# Patient Record
Sex: Female | Born: 2000 | Race: White | Hispanic: No | Marital: Single | Smoking: Never smoker
Health system: Southern US, Community
[De-identification: ages and names within clinical notes are randomized; demographics above are authoritative.]

## PROBLEM LIST (undated history)

## (undated) DIAGNOSIS — Z789 Other specified health status: Secondary | ICD-10-CM

## (undated) DIAGNOSIS — D649 Anemia, unspecified: Secondary | ICD-10-CM

## (undated) DIAGNOSIS — U071 COVID-19: Secondary | ICD-10-CM

## (undated) HISTORY — DX: Other specified health status: Z78.9

## (undated) HISTORY — PX: NO PAST SURGERIES: SHX2092

---
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
## 2019-02-08 ENCOUNTER — Emergency Department (HOSPITAL_COMMUNITY)
Admission: EM | Admit: 2019-02-08 | Discharge: 2019-02-08 | Disposition: A | Discharge status: Home / Self Care | Payer: Medicaid Other | Attending: Emergency Medicine | Admitting: Emergency Medicine

## 2019-02-08 ENCOUNTER — Other Ambulatory Visit: Payer: Self-pay

## 2019-02-08 ENCOUNTER — Encounter (HOSPITAL_COMMUNITY): Payer: Self-pay | Admitting: *Deleted

## 2019-02-08 DIAGNOSIS — U071 COVID-19: Secondary | ICD-10-CM | POA: Diagnosis not present

## 2019-02-08 DIAGNOSIS — J029 Acute pharyngitis, unspecified: Secondary | ICD-10-CM

## 2019-02-08 DIAGNOSIS — R430 Anosmia: Secondary | ICD-10-CM

## 2019-02-08 DIAGNOSIS — O9989 Other specified diseases and conditions complicating pregnancy, childbirth and the puerperium: Secondary | ICD-10-CM | POA: Diagnosis present

## 2019-02-08 DIAGNOSIS — Z3A24 24 weeks gestation of pregnancy: Secondary | ICD-10-CM | POA: Insufficient documentation

## 2019-02-08 DIAGNOSIS — Z79899 Other long term (current) drug therapy: Secondary | ICD-10-CM | POA: Insufficient documentation

## 2019-02-08 NOTE — ED Notes (Signed)
Patient verbalizes understanding of discharge instructions. Opportunity for questioning and answers were provided. Armband removed by staff, pt discharged from ED. Pt ambulatory to lobby. Follow up care reviewed 

## 2019-02-08 NOTE — ED Notes (Signed)
Fetal Heart Rate: 151 Pt HR: 75

## 2019-02-08 NOTE — ED Triage Notes (Signed)
Pt is here to get a Covid 19 test.  She states loss of smell and taste, vomiting.  Pt is [redacted] weeks pregnant but denies any abdominal pain or vag s/s.

## 2019-02-08 NOTE — Discharge Instructions (Signed)
You were tested for covid today.  The results should return 24- 48 hours.  If positive, you should receive a phone call.  Either way, you may check online on MyChart. You likely have a viral illness.  Use Tylenol as needed for sore throat, aches, or fever. Follow-up with your OB/GYN for further management of your pregnancy.  I also included information for the [facility].  Regardless, you should talk to your OB/GYN about delivery plans. Return to the emergency room with any new, worsening, concerning symptoms

## 2019-02-08 NOTE — ED Provider Notes (Signed)
[facility] EMERGENCY DEPARTMENT Provider Note   CSN: [id number] Arrival date & time: 02/08/19  1509    History   Chief Complaint Chief Complaint  Patient presents with   Headache/ covid test    loss of taste and smell   Routine Prenatal Visit    HPI Rayleen Wyrick is a 18 y.o. female presenting for evaluation of decreased taste and smell.  Patient states she is concerned that she has coded.  She is [redacted] weeks pregnant, and her boyfriend recently had nasal congestion and sore throat.  Over the past week, she has developed decreased taste and smell.  She also reports a mild sore throat.  She denies fevers, chills, cough, chest pain, shortness of breath, vomiting, abdominal pain, urinary symptoms, normal bowel movements.  She states she has felt slightly nauseous.  She denies vaginal bleeding or vaginal discharge.  She denies sick contact other than her boyfriend.  She denies contact with known COVID-19 positive person.  Patient is also concerned about preeclampsia, because her sister had that when she was pregnant.  Patient is getting her OB care through the health department.  Patient states she has no medical problems, takes medications daily.     HPI  Past Medical History:  Diagnosis Date   Medical history non-contributory     Patient Active Problem List   Diagnosis Date Noted   Nonintractable headache 07/02/2018   Pelvic cramping 07/02/2018    Past Surgical History:  Procedure Laterality Date   NO PAST SURGERIES       OB History    Gravida  1   Para      Term      Preterm      AB      Living        SAB      TAB      Ectopic      Multiple      Live Births               Home Medications    Prior to Admission medications   Medication Sig Start Date End Date Taking? Authorizing Provider  naproxen (NAPROSYN) 500 MG tablet Take 1 tablet (500 mg total) by mouth 2 (two) times daily with a meal. Patient not taking: Reported  on 11/26/2018 07/02/18   Alfonso Ramus T, FNP  norethindrone-ethinyl estradiol-iron (JUNEL FE 1.5/30) 1.5-30 MG-MCG tablet Take 1 tablet by mouth daily. Patient not taking: Reported on 11/26/2018 07/09/18   Verneda Skill, FNP  Prenatal Vit-Fe Fumarate-FA (PRENATAL VITAMIN PO) Take by mouth.    [provider]    Family History No family history on file.  Social History Social History   Tobacco Use   Smoking status: Never Smoker   Smokeless tobacco: Never Used  Substance Use Topics   Alcohol use: Not Currently   Drug use: Never     Allergies   Patient has no known allergies.   Review of Systems Review of Systems  HENT: Positive for sore throat.        Loss of taste and smell  Gastrointestinal: Positive for nausea.  All other systems reviewed and are negative.    Physical Exam Updated Vital Signs BP 113/74 (BP Location: Left Arm)    Pulse 75    Temp 98.3 F (36.8 C) (Oral)    Resp 16    Ht  (1.575 m)    Wt 68.9 kg    LMP 08/21/2018  SpO2 99%    BMI 27.80 kg/m   Physical Exam Vitals signs and nursing note reviewed.  Constitutional:      General: She is not in acute distress.    Appearance: She is well-developed.     Comments: Appears nontoxic  HENT:     Head: Normocephalic and atraumatic.     Mouth/Throat:     Lips: Pink.     Pharynx: Posterior oropharyngeal erythema present.     Comments: Mild erythema.  No tonsillar swelling or exudate.  Uvula midline with palate rise.  Handling secretions easily.  No muffled voice. Eyes:     Conjunctiva/sclera: Conjunctivae normal.     Pupils: Pupils are equal, round, and reactive to light.  Neck:     Musculoskeletal: Normal range of motion and neck supple.  Cardiovascular:     Rate and Rhythm: Normal rate and regular rhythm.     Pulses: Normal pulses.  Pulmonary:     Effort: Pulmonary effort is normal. No respiratory distress.     Breath sounds: Normal breath sounds. No wheezing.     Comments:  Speaking in full sentences.  Clear lung sounds in all fields. Abdominal:     General: There is no distension.     Palpations: Abdomen is soft. There is no mass.     Tenderness: There is no abdominal tenderness. There is no guarding or rebound.  Musculoskeletal: Normal range of motion.  Skin:    General: Skin is warm and dry.     Capillary Refill: Capillary refill takes less than 2 seconds.  Neurological:     Mental Status: She is alert and oriented to person, place, and time.      ED Treatments / Results  Labs (all labs ordered are listed, but only abnormal results are displayed) Labs Reviewed  NOVEL CORONAVIRUS, NAA (HOSPITAL ORDER, SEND-OUT TO REF LAB)    EKG None  Radiology No results found.  Procedures Procedures (including critical care time)  Medications Ordered in ED Medications - No data to display   Initial Impression / Assessment and Plan / ED Course  I have reviewed the triage vital signs and the nursing notes.  Pertinent labs & imaging results that were available during my care of the patient were reviewed by me and considered in my medical decision making (see chart for details).        Patient presenting due to concerns for coronavirus.  Physical examination, she appears nontoxic.   She is afebrile not tachycardic.  She denies respiratory symptoms such as cough, fever, shortness of breath.  She does report a mild sore throat which began today.  Not consistent with strep. patient's main symptoms are loss of taste and smell.  Additionally, patient is [redacted] weeks pregnant.  She is concerned about preeclampsia, although her blood pressure today is reassuring.  She has no history of preeclampsia in this pregnancy.  She has no leg swelling.  Doubt preeclampsia at this time. Fetal heart tones reassuring.  Discussed with patient that my suspicion for COVID is low at this time, however considering that she is pregnant, will test for coronavirus.  Discussed results are  pending, she should get a phone call if positive.  Discussed importance of quarantine until stools return.  Encouraged patient to follow-up with her OB/GYN for further evaluation and management of her pregnancy.  At this time, patient appears safe for discharge.  Return precautions given.  Patient states she understands and agrees plan.  Lorinda CreedKeissy Tufte was evaluated  in Emergency Department on 02/08/2019 for the symptoms described in the history of present illness. She was evaluated in the context of the global COVID-19 pandemic, which necessitated consideration that the patient might be at risk for infection with the SARS-CoV-2 virus that causes COVID-19. Institutional protocols and algorithms that pertain to the evaluation of patients at risk for COVID-19 are in a state of rapid change based on information released by regulatory bodies including the CDC and federal and state organizations. These policies and algorithms were followed during the patient's care in the ED.   Final Clinical Impressions(s) / ED Diagnoses   Final diagnoses:  Anosmia  Sore throat  [redacted] weeks gestation of pregnancy    ED Discharge Orders    None       Alveria Apley, PA-C 02/08/19 1853    Cathren Laine, MD 02/09/19 1423

## 2019-02-10 LAB — NOVEL CORONAVIRUS, NAA (HOSP ORDER, SEND-OUT TO REF LAB; TAT 18-24 HRS): SARS-CoV-2, NAA: DETECTED — AB

## 2019-03-02 ENCOUNTER — Encounter: Payer: Self-pay | Admitting: Advanced Practice Midwife

## 2019-03-02 DIAGNOSIS — U071 COVID-19: Secondary | ICD-10-CM | POA: Insufficient documentation

## 2019-05-05 LAB — OB RESULTS CONSOLE GBS: GBS: POSITIVE

## 2019-05-06 ENCOUNTER — Other Ambulatory Visit: Payer: Self-pay | Admitting: Obstetrics and Gynecology

## 2019-05-06 DIAGNOSIS — O365930 Maternal care for other known or suspected poor fetal growth, third trimester, not applicable or unspecified: Secondary | ICD-10-CM

## 2019-05-06 NOTE — Progress Notes (Signed)
[facility] pt with U/S growth < 3 % at 36 6/7 weeks Formal U/S and doppler studies with MFM ordered

## 2019-05-07 ENCOUNTER — Other Ambulatory Visit: Payer: Self-pay

## 2019-05-07 ENCOUNTER — Ambulatory Visit (HOSPITAL_COMMUNITY): Payer: Medicaid Other | Admitting: *Deleted

## 2019-05-07 ENCOUNTER — Other Ambulatory Visit: Payer: Self-pay | Admitting: Obstetrics and Gynecology

## 2019-05-07 ENCOUNTER — Encounter (HOSPITAL_COMMUNITY): Payer: Self-pay

## 2019-05-07 ENCOUNTER — Ambulatory Visit (HOSPITAL_COMMUNITY)
Admission: RE | Admit: 2019-05-07 | Discharge: 2019-05-07 | Disposition: A | Discharge status: Home / Self Care | Payer: Medicaid Other | Source: Ambulatory Visit | Attending: Obstetrics and Gynecology | Admitting: Obstetrics and Gynecology

## 2019-05-07 VITALS — BP 111/81 | HR 90 | Temp 98.5°F

## 2019-05-07 DIAGNOSIS — Z3A37 37 weeks gestation of pregnancy: Secondary | ICD-10-CM

## 2019-05-07 DIAGNOSIS — O26843 Uterine size-date discrepancy, third trimester: Secondary | ICD-10-CM | POA: Diagnosis present

## 2019-05-07 DIAGNOSIS — O365930 Maternal care for other known or suspected poor fetal growth, third trimester, not applicable or unspecified: Secondary | ICD-10-CM

## 2019-05-07 DIAGNOSIS — Z363 Encounter for antenatal screening for malformations: Secondary | ICD-10-CM | POA: Diagnosis not present

## 2019-05-07 NOTE — Procedures (Signed)
Katie Ochoa 2001-03-23 [redacted]w[redacted]d  Fetus A Non-Stress Test Interpretation for 05/07/19  Indication: IUGR  Fetal Heart Rate A Mode: External Baseline Rate (A): 135 bpm Variability: Moderate Accelerations: 15 x 15 Decelerations: None  Uterine Activity Mode: Toco Contraction Frequency (min): occ UC noted Contraction Duration (sec): 80 Contraction Quality: Mild Resting Tone Palpated: Relaxed Resting Time: Adequate  Interpretation (Fetal Testing) Nonstress Test Interpretation: Reactive Comments: FHR tracing rev'd by Dr. Donalee Citrin

## 2019-05-07 NOTE — Progress Notes (Signed)
Admission orders for G1PO [facility] with IUGR < 3 %. Pt made of aware of need for admission and date and time of IOL.

## 2019-05-10 ENCOUNTER — Other Ambulatory Visit (HOSPITAL_COMMUNITY): Payer: Self-pay | Admitting: Advanced Practice Midwife

## 2019-05-11 ENCOUNTER — Inpatient Hospital Stay (HOSPITAL_COMMUNITY): Payer: Medicaid Other

## 2019-05-11 ENCOUNTER — Other Ambulatory Visit: Payer: Self-pay

## 2019-05-11 ENCOUNTER — Inpatient Hospital Stay (HOSPITAL_COMMUNITY)
Admission: AD | Admit: 2019-05-11 | Discharge: 2019-05-14 | DRG: 807 | Disposition: A | Discharge status: Home / Self Care | Payer: Medicaid Other | Attending: Family Medicine | Admitting: Family Medicine

## 2019-05-11 ENCOUNTER — Encounter (HOSPITAL_COMMUNITY): Payer: Self-pay

## 2019-05-11 DIAGNOSIS — O365990 Maternal care for other known or suspected poor fetal growth, unspecified trimester, not applicable or unspecified: Secondary | ICD-10-CM | POA: Diagnosis present

## 2019-05-11 DIAGNOSIS — Z30017 Encounter for initial prescription of implantable subdermal contraceptive: Secondary | ICD-10-CM

## 2019-05-11 DIAGNOSIS — B951 Streptococcus, group B, as the cause of diseases classified elsewhere: Secondary | ICD-10-CM | POA: Diagnosis present

## 2019-05-11 DIAGNOSIS — O99824 Streptococcus B carrier state complicating childbirth: Secondary | ICD-10-CM | POA: Diagnosis present

## 2019-05-11 DIAGNOSIS — O99214 Obesity complicating childbirth: Secondary | ICD-10-CM | POA: Diagnosis present

## 2019-05-11 DIAGNOSIS — Z3A37 37 weeks gestation of pregnancy: Secondary | ICD-10-CM

## 2019-05-11 DIAGNOSIS — E669 Obesity, unspecified: Secondary | ICD-10-CM | POA: Diagnosis present

## 2019-05-11 DIAGNOSIS — O365930 Maternal care for other known or suspected poor fetal growth, third trimester, not applicable or unspecified: Principal | ICD-10-CM | POA: Diagnosis present

## 2019-05-11 DIAGNOSIS — O134 Gestational [pregnancy-induced] hypertension without significant proteinuria, complicating childbirth: Secondary | ICD-10-CM | POA: Diagnosis present

## 2019-05-11 DIAGNOSIS — Z20828 Contact with and (suspected) exposure to other viral communicable diseases: Secondary | ICD-10-CM | POA: Diagnosis present

## 2019-05-11 DIAGNOSIS — O365931 Maternal care for other known or suspected poor fetal growth, third trimester, fetus 1: Secondary | ICD-10-CM

## 2019-05-11 HISTORY — DX: Anemia, unspecified: D64.9

## 2019-05-11 HISTORY — DX: COVID-19: U07.1

## 2019-05-11 LAB — CBC
HCT: 37.4 % (ref 36.0–46.0)
Hemoglobin: 12.4 g/dL (ref 12.0–15.0)
MCH: 28 pg (ref 26.0–34.0)
MCHC: 33.2 g/dL (ref 30.0–36.0)
MCV: 84.4 fL (ref 80.0–100.0)
Platelets: 310 10*3/uL (ref 150–400)
RBC: 4.43 MIL/uL (ref 3.87–5.11)
RDW: 13.7 % (ref 11.5–15.5)
WBC: 11.5 10*3/uL — ABNORMAL HIGH (ref 4.0–10.5)
nRBC: 0 % (ref 0.0–0.2)

## 2019-05-11 LAB — RPR: RPR Ser Ql: NONREACTIVE

## 2019-05-11 LAB — TYPE AND SCREEN
ABO/RH(D): O POS
Antibody Screen: NEGATIVE

## 2019-05-11 LAB — ABO/RH: ABO/RH(D): O POS

## 2019-05-11 LAB — SARS CORONAVIRUS 2 (TAT 6-24 HRS): SARS Coronavirus 2: NEGATIVE

## 2019-05-11 MED ORDER — ONDANSETRON HCL 4 MG/2ML IJ SOLN
4.0000 mg | Freq: Four times a day (QID) | INTRAMUSCULAR | Status: DC | PRN
  Administered 2019-05-11: 4 mg via INTRAVENOUS
  Filled 2019-05-11: qty 2

## 2019-05-11 MED ORDER — OXYCODONE-ACETAMINOPHEN 5-325 MG PO TABS: 1.0000 | ORAL_TABLET | ORAL | Status: DC | PRN

## 2019-05-11 MED ORDER — TERBUTALINE SULFATE 1 MG/ML IJ SOLN: 0.2500 mg | Freq: Once | INTRAMUSCULAR | Status: DC | PRN

## 2019-05-11 MED ORDER — LACTATED RINGERS IV SOLN
INTRAVENOUS | Status: DC
  Administered 2019-05-11 – 2019-05-12 (×4): via INTRAVENOUS

## 2019-05-11 MED ORDER — SOD CITRATE-CITRIC ACID 500-334 MG/5ML PO SOLN: 30.0000 mL | ORAL | Status: DC | PRN

## 2019-05-11 MED ORDER — SODIUM CHLORIDE 0.9 % IV SOLN
5.0000 10*6.[IU] | Freq: Once | INTRAVENOUS | Status: AC
  Administered 2019-05-11: 5 10*6.[IU] via INTRAVENOUS
  Filled 2019-05-11: qty 5

## 2019-05-11 MED ORDER — MISOPROSTOL 50MCG HALF TABLET
50.0000 ug | ORAL_TABLET | ORAL | Status: DC | PRN
  Administered 2019-05-11: 22:00:00 50 ug via ORAL
  Filled 2019-05-11: qty 1

## 2019-05-11 MED ORDER — OXYTOCIN BOLUS FROM INFUSION
500.0000 mL | Freq: Once | INTRAVENOUS | Status: AC
  Administered 2019-05-12: 09:00:00 500 mL via INTRAVENOUS

## 2019-05-11 MED ORDER — ACETAMINOPHEN 325 MG PO TABS: 650.0000 mg | ORAL_TABLET | ORAL | Status: DC | PRN

## 2019-05-11 MED ORDER — FENTANYL CITRATE (PF) 100 MCG/2ML IJ SOLN
100.0000 ug | INTRAMUSCULAR | Status: DC | PRN
  Administered 2019-05-12 (×2): 100 ug via INTRAVENOUS
  Filled 2019-05-11 (×3): qty 2

## 2019-05-11 MED ORDER — LACTATED RINGERS IV SOLN
500.0000 mL | INTRAVENOUS | Status: DC | PRN
  Administered 2019-05-11 – 2019-05-12 (×2): 500 mL via INTRAVENOUS

## 2019-05-11 MED ORDER — PENICILLIN G 3 MILLION UNITS IVPB - SIMPLE MED
3.0000 10*6.[IU] | INTRAVENOUS | Status: DC
  Administered 2019-05-11 – 2019-05-12 (×5): 3 10*6.[IU] via INTRAVENOUS
  Filled 2019-05-11 (×7): qty 100

## 2019-05-11 MED ORDER — OXYTOCIN 40 UNITS IN NORMAL SALINE INFUSION - SIMPLE MED
2.5000 [IU]/h | INTRAVENOUS | Status: DC
  Filled 2019-05-11: qty 1000

## 2019-05-11 MED ORDER — FENTANYL CITRATE (PF) 100 MCG/2ML IJ SOLN: 50.0000 ug | INTRAMUSCULAR | Status: DC | PRN

## 2019-05-11 MED ORDER — MISOPROSTOL 50MCG HALF TABLET
ORAL_TABLET | ORAL | Status: AC
  Administered 2019-05-12: 50 ug
  Filled 2019-05-11: qty 1

## 2019-05-11 MED ORDER — LIDOCAINE HCL (PF) 1 % IJ SOLN
30.0000 mL | INTRAMUSCULAR | Status: AC | PRN
  Administered 2019-05-12: 30 mL via SUBCUTANEOUS
  Filled 2019-05-11: qty 30

## 2019-05-11 MED ORDER — OXYCODONE-ACETAMINOPHEN 5-325 MG PO TABS: 2.0000 | ORAL_TABLET | ORAL | Status: DC | PRN

## 2019-05-11 MED ORDER — MISOPROSTOL 25 MCG QUARTER TABLET
25.0000 ug | ORAL_TABLET | ORAL | Status: DC | PRN
  Administered 2019-05-11 (×3): 25 ug via VAGINAL
  Filled 2019-05-11 (×3): qty 1

## 2019-05-11 NOTE — Progress Notes (Signed)
Patient ID: Katie Ochoa, female   DOB: [birth year], 18 y.o.   MRN: [id number] Katie Ochoa is a 18 y.o. G1P0 at [redacted]w[redacted]d admitted for induction of labor due to FGR 3%.  Subjective: Mild cramping  Objective: BP 128/88   Pulse 97   Temp 99.2 F (37.3 C) (Oral)   Resp 15   Ht 5\' 1"  (1.549 m)   Wt 78 kg   LMP 08/21/2018   SpO2 98%   BMI 32.50 kg/m  No intake/output data recorded.  FHT:  FHR: 125 bpm, variability: moderate,  accelerations:  Present,  decelerations:  Absent UC:   q 2-15mins  SVE:   Dilation: 1 Effacement (%): Thick Station: -2, -3 Exam by:: Gertie Exon, CNM)   Cervical foley bulb inserted and inflated w/ 48ml LR w/o difficulty   Labs: Lab Results  Component Value Date   WBC 11.5 (H) 05/11/2019   HGB 12.4 05/11/2019   HCT 37.4 05/11/2019   MCV 84.4 05/11/2019   PLT 310 05/11/2019    Assessment / Plan: IOL d/t FGR, s/p cytotec x 3, now has cervical foley bulb, will continue cytotec, plan pitocin once foley bulb out  Labor: cervical ripening phase Fetal Wellbeing:  Category I Pain Control:  n/a Pre-eclampsia: n/a I/D:  GBS+ on PCN Anticipated MOD:  NSVD  Roma Schanz CNM, WHNP-BC 05/11/2019, 2115

## 2019-05-11 NOTE — H&P (Addendum)
Katie Ochoa is a 18 y.o. female G1 at 37.4wks by LMP and confirmed by 13wk scan  presenting for IOL due to [place] diagnosed at 37wks with EFW 3rd% and HC/AC/FL all <5th%. Rec for IOL by MFM prior to 38wks due to severe FGR vs constitutionally small baby. Denies leaking or bldg; no reg ctx; no H/A, visual disturbances or N/V. Her preg has been followed by the [facility] and has been remarkable for:  # FGR dx at 37wks (EFW 3rd%, HC/FL/AC all <5th%, nl fluid & dopplers) # COVID+ on 02/08/19 with full recovery # GBS+  OB History    Gravida  1   Para      Term      Preterm      AB      Living        SAB      TAB      Ectopic      Multiple      Live Births             Past Medical History:  Diagnosis Date  . Anemia   . COVID-19    02/08/19   Past Surgical History:  Procedure Laterality Date  . NO PAST SURGERIES     Family History: family history is not on file. Social History:  reports that she has never smoked. She has never used smokeless tobacco. She reports previous alcohol use. She reports that she does not use drugs.     Maternal Diabetes: No Genetic Screening: Normal Maternal Ultrasounds/Referrals: IUGR Fetal Ultrasounds or other Referrals:  Referred to Materal Fetal Medicine  Maternal Substance Abuse:  No Significant Maternal Medications:  None Significant Maternal Lab Results:  Group B Strep positive Other Comments:  None  ROS History Dilation: Closed Effacement (%): Thick Station: -1 Exam by:: Sharyn Lull, RN  Blood pressure 124/68, pulse 72, temperature 98.5 F (36.9 C), temperature source Oral, resp. rate 18, height 5\' 1"  (1.549 m), weight 78 kg, last menstrual period 08/21/2018, SpO2 96 %. Exam Physical Exam  Constitutional: She is oriented to person, place, and time. She appears well-developed.  HENT:  Head: Normocephalic.  Neck: Normal range of motion.  Cardiovascular: Normal rate.  Respiratory: Effort normal.  GI:  EFM 130s, +accels, no decels,  Cat 1 Irreg mild ctx  Musculoskeletal: Normal range of motion.  Neurological: She is alert and oriented to person, place, and time.  Skin: Skin is warm and dry.  Psychiatric: She has a normal mood and affect. Her behavior is normal. Thought content normal.    Prenatal labs: ABO, Rh: --/--/O POS (08/25 0825) Antibody: NEG (08/25 0825) Rubella: Immune (03/12 0000) RPR: NON-REACTIVE (10/17 1037)  HBsAg: Negative (03/12 0000)  HIV: Non-reactive (03/12 0000)  GBS: Positive (08/19 0000)   Assessment/Plan: IUP@37 .4wks Severe FGR with nl fluid & dopplers Cx unfavorable GBS pos  -Admit to Labor & Delivery -Plan cx ripening with cytotec/cervical foley to start, followed by Pit/AROM prn -PCN for GBS ppx -Anticipate vag del   Myrtis Ser CNM 05/11/2019, 10:30 AM

## 2019-05-11 NOTE — Progress Notes (Signed)
Patient ID: Katie Ochoa, female   DOB: 18-Feb-2001, 18 y.o.   MRN: [id number]  Called by RN for eval of fetal presentation; pt feeling a little crampy, s/p first dose of cytotec x 4hrs; receiving PCN  BP 133/89, 131/79, other VSS FHR 135, +accels, no decels, Cat 1 Ctx irreg 3-5 mins, mild B/S ultrasound shows vtx presentation  IUP@37 .4wks FGR Cx unfavorable GBS pos  Continue with cytotec induction with exams q 4hrs- will place cervical foley when able  Myrtis Ser CNM 05/11/2019 1:57 PM

## 2019-05-12 ENCOUNTER — Inpatient Hospital Stay (HOSPITAL_COMMUNITY): Payer: Medicaid Other | Admitting: Anesthesiology

## 2019-05-12 ENCOUNTER — Encounter (HOSPITAL_COMMUNITY): Payer: Self-pay | Admitting: *Deleted

## 2019-05-12 DIAGNOSIS — Z3A37 37 weeks gestation of pregnancy: Secondary | ICD-10-CM

## 2019-05-12 DIAGNOSIS — O99824 Streptococcus B carrier state complicating childbirth: Secondary | ICD-10-CM

## 2019-05-12 DIAGNOSIS — O134 Gestational [pregnancy-induced] hypertension without significant proteinuria, complicating childbirth: Secondary | ICD-10-CM

## 2019-05-12 LAB — COMPREHENSIVE METABOLIC PANEL
ALT: 10 U/L (ref 0–44)
AST: 13 U/L — ABNORMAL LOW (ref 15–41)
Albumin: 2.6 g/dL — ABNORMAL LOW (ref 3.5–5.0)
Alkaline Phosphatase: 210 U/L — ABNORMAL HIGH (ref 38–126)
Anion gap: 12 (ref 5–15)
BUN: 8 mg/dL (ref 6–20)
CO2: 18 mmol/L — ABNORMAL LOW (ref 22–32)
Calcium: 9.1 mg/dL (ref 8.9–10.3)
Chloride: 105 mmol/L (ref 98–111)
Creatinine, Ser: 0.54 mg/dL (ref 0.44–1.00)
GFR calc Af Amer: >60 mL/min (ref >60)
GFR calc non Af Amer: >60 mL/min (ref >60)
Glucose, Bld: 95 mg/dL (ref 70–99)
Potassium: 4.1 mmol/L (ref 3.5–5.1)
Sodium: 135 mmol/L (ref 135–145)
Total Bilirubin: 0.6 mg/dL (ref 0.3–1.2)
Total Protein: 5.5 g/dL — ABNORMAL LOW (ref 6.5–8.1)

## 2019-05-12 LAB — CBC
HCT: 37 % (ref 36.0–46.0)
Hemoglobin: 12.5 g/dL (ref 12.0–15.0)
MCH: 28.3 pg (ref 26.0–34.0)
MCHC: 33.8 g/dL (ref 30.0–36.0)
MCV: 83.9 fL (ref 80.0–100.0)
Platelets: 291 10*3/uL (ref 150–400)
RBC: 4.41 MIL/uL (ref 3.87–5.11)
RDW: 13.6 % (ref 11.5–15.5)
WBC: 19.1 10*3/uL — ABNORMAL HIGH (ref 4.0–10.5)
nRBC: 0 % (ref 0.0–0.2)

## 2019-05-12 LAB — PROTEIN / CREATININE RATIO, URINE
Creatinine, Urine: 33.17 mg/dL
Protein Creatinine Ratio: 0.27 mg/mg{Cre} — ABNORMAL HIGH (ref 0.00–0.15)
Total Protein, Urine: 9 mg/dL

## 2019-05-12 MED ORDER — ACETAMINOPHEN 325 MG PO TABS: 650.0000 mg | ORAL_TABLET | ORAL | Status: DC | PRN

## 2019-05-12 MED ORDER — MAGNESIUM HYDROXIDE 400 MG/5ML PO SUSP: 30.0000 mL | ORAL | Status: DC | PRN

## 2019-05-12 MED ORDER — WITCH HAZEL-GLYCERIN EX PADS: 1.0000 "application " | MEDICATED_PAD | CUTANEOUS | Status: DC | PRN

## 2019-05-12 MED ORDER — BENZOCAINE-MENTHOL 20-0.5 % EX AERO
1.0000 "application " | INHALATION_SPRAY | CUTANEOUS | Status: DC | PRN
  Administered 2019-05-12: 1 via TOPICAL
  Filled 2019-05-12: qty 56

## 2019-05-12 MED ORDER — LIDOCAINE HCL (PF) 1 % IJ SOLN
INTRAMUSCULAR | Status: DC | PRN
  Administered 2019-05-12: 2 mL via EPIDURAL
  Administered 2019-05-12: 3 mL via EPIDURAL
  Administered 2019-05-12: 5 mL via EPIDURAL

## 2019-05-12 MED ORDER — PRENATAL MULTIVITAMIN CH
1.0000 | ORAL_TABLET | Freq: Every day | ORAL | Status: DC
  Administered 2019-05-12 – 2019-05-14 (×3): 1 via ORAL
  Filled 2019-05-12 (×3): qty 1

## 2019-05-12 MED ORDER — SIMETHICONE 80 MG PO CHEW: 80.0000 mg | CHEWABLE_TABLET | ORAL | Status: DC | PRN

## 2019-05-12 MED ORDER — TERBUTALINE SULFATE 1 MG/ML IJ SOLN: 0.2500 mg | Freq: Once | INTRAMUSCULAR | Status: DC | PRN

## 2019-05-12 MED ORDER — ZOLPIDEM TARTRATE 5 MG PO TABS: 5.0000 mg | ORAL_TABLET | Freq: Once | ORAL | Status: DC

## 2019-05-12 MED ORDER — COCONUT OIL OIL: 1.0000 "application " | TOPICAL_OIL | Status: DC | PRN

## 2019-05-12 MED ORDER — OXYTOCIN 40 UNITS IN NORMAL SALINE INFUSION - SIMPLE MED
1.0000 m[IU]/min | INTRAVENOUS | Status: DC
  Administered 2019-05-12: 2 m[IU]/min via INTRAVENOUS

## 2019-05-12 MED ORDER — SODIUM CHLORIDE (PF) 0.9 % IJ SOLN
INTRAMUSCULAR | Status: DC | PRN
  Administered 2019-05-12: 12 mL/h via EPIDURAL

## 2019-05-12 MED ORDER — ONDANSETRON HCL 4 MG/2ML IJ SOLN: 4.0000 mg | INTRAMUSCULAR | Status: DC | PRN

## 2019-05-12 MED ORDER — ONDANSETRON HCL 4 MG PO TABS: 4.0000 mg | ORAL_TABLET | ORAL | Status: DC | PRN

## 2019-05-12 MED ORDER — SENNOSIDES-DOCUSATE SODIUM 8.6-50 MG PO TABS
2.0000 | ORAL_TABLET | ORAL | Status: DC
  Administered 2019-05-12 – 2019-05-13 (×2): 2 via ORAL
  Filled 2019-05-12 (×2): qty 2

## 2019-05-12 MED ORDER — DIBUCAINE (PERIANAL) 1 % EX OINT: 1.0000 "application " | TOPICAL_OINTMENT | CUTANEOUS | Status: DC | PRN

## 2019-05-12 MED ORDER — IBUPROFEN 600 MG PO TABS
600.0000 mg | ORAL_TABLET | Freq: Four times a day (QID) | ORAL | Status: DC
  Administered 2019-05-12 – 2019-05-14 (×8): 600 mg via ORAL
  Filled 2019-05-12 (×9): qty 1

## 2019-05-12 MED ORDER — DIPHENHYDRAMINE HCL 25 MG PO CAPS: 25.0000 mg | ORAL_CAPSULE | Freq: Four times a day (QID) | ORAL | Status: DC | PRN

## 2019-05-12 MED ORDER — FENTANYL-BUPIVACAINE-NACL 0.5-0.125-0.9 MG/250ML-% EP SOLN
EPIDURAL | Status: AC
  Filled 2019-05-12: qty 250

## 2019-05-12 NOTE — Anesthesia Postprocedure Evaluation (Signed)
Anesthesia Post Note  Patient: Katie Ochoa  Procedure(s) Performed: AN AD HOC LABOR EPIDURAL     Anesthesia Post Evaluation  Last Vitals:  Vitals:   05/12/19 1207 05/12/19 1650  BP: 113/86 110/78  Pulse: 78 82  Resp: 18 18  Temp: 36.7 C 36.8 C  SpO2:      Last Pain:  Vitals:   05/12/19 1650  TempSrc: Axillary  PainSc: 0-No pain   Pain Goal: Patients Stated Pain Goal: 3 (05/11/19 4097)                 Barkley Boards

## 2019-05-12 NOTE — Anesthesia Procedure Notes (Signed)
Epidural Patient location during procedure: OB Start time: 05/12/2019 7:37 AM End time: 05/12/2019 7:44 AM  Staffing Anesthesiologist: Catalina Gravel, MD Performed: anesthesiologist   Preanesthetic Checklist Completed: patient identified, pre-op evaluation, timeout performed, IV checked, risks and benefits discussed and monitors and equipment checked  Epidural Patient position: sitting Prep: DuraPrep Patient monitoring: blood pressure and continuous pulse ox Approach: midline Location: L3-L4 Injection technique: LOR air  Needle:  Needle type: Tuohy  Needle gauge: 17 G Needle length: 9 cm Needle insertion depth: 6 cm Catheter size: 19 Gauge Catheter at skin depth: 11 cm Test dose: negative and Other (1% Lidocaine)  Additional Notes Patient identified.  Risk benefits discussed including failed block, incomplete pain control, headache, nerve damage, paralysis, blood pressure changes, nausea, vomiting, reactions to medication both toxic or allergic, and postpartum back pain.  Patient expressed understanding and wished to proceed.  All questions were answered.  Sterile technique used throughout procedure and epidural site dressed with sterile barrier dressing. No paresthesia or other complications noted. The patient did not experience any signs of intravascular injection such as tinnitus or metallic taste in mouth nor signs of intrathecal spread such as rapid motor block. Please see nursing notes for vital signs. Reason for block:procedure for pain

## 2019-05-12 NOTE — Discharge Summary (Addendum)
Postpartum Discharge Summary     Patient Name: Katie Ochoa DOB: 2000-11-29 MRN: [id number]  Date of admission: 05/11/2019 Delivering Provider: Venora MaplesECKSTAT, MATTHEW M   Date of discharge: 05/14/2019  Admitting diagnosis: pregnancy Intrauterine pregnancy: 5763w5d     Secondary diagnosis:  Active Problems:   IUGR (intrauterine growth restriction) affecting care of mother   Positive GBS test  Additional problems:  Intrapartum gHTN     Discharge diagnosis: Term Pregnancy Delivered and Gestational Hypertension                                                                                          Post partum procedures:Nexplanon  Augmentation: Pitocin, Cytotec and Foley Balloon  Complications: None  Hospital course:  Induction of Labor With Vaginal Delivery   18 y.o. yo G1P0 at 7663w5d was admitted to the hospital 05/11/2019 for induction of labor.  Indication for induction: IUGR, gHTN.  Patient had an uncomplicated labor course as follows: Membrane Rupture Time/Date: 6:54 AM ,05/12/2019   Intrapartum Procedures: Episiotomy: None [1]                                         Lacerations:  2nd degree [3]  Patient had delivery of a Viable infant.  Information for the patient's newborn:  Leatrice JewelsMedina, Boy Lillianah [[id number]][[id number]]  Delivery Method: Vag-Spont    05/12/2019  Details of delivery can be found in separate delivery note.  Patient had a routine postpartum course. Patient is discharged home 05/14/19.  Magnesium Sulfate recieved: No BMZ received: No  Physical exam  Vitals:   05/13/19 0527 05/13/19 1444 05/13/19 2132 05/14/19 0621  BP: 120/75 114/74 124/83 122/90  Pulse: (!) 101 70 83 76  Resp: 18 18 18 17   Temp: 98.3 F (36.8 C) 98.8 F (37.1 C) 97.6 F (36.4 C) 98.7 F (37.1 C)  TempSrc: Oral Oral Oral Oral  SpO2: 99%  100% 100%  Weight:      Height:       General: alert, cooperative and no distress Lochia: appropriate Uterine Fundus: firm Incision: N/A DVT Evaluation: No  evidence of DVT seen on physical exam. Labs: Lab Results  Component Value Date   WBC 19.1 (H) 05/12/2019   HGB 12.5 05/12/2019   HCT 37.0 05/12/2019   MCV 83.9 05/12/2019   PLT 291 05/12/2019   CMP Latest Ref Rng & Units 05/12/2019  Glucose 70 - 99 mg/dL 95  BUN 6 - 20 mg/dL 8  Creatinine 7.820.44 - 9.561.00 mg/dL 2.130.54  Sodium 086135 - 578145 mmol/L 135  Potassium 3.5 - 5.1 mmol/L 4.1  Chloride 98 - 111 mmol/L 105  CO2 22 - 32 mmol/L 18(L)  Calcium 8.9 - 10.3 mg/dL 9.1  Total Protein 6.5 - 8.1 g/dL 4.6(N5.5(L)  Total Bilirubin 0.3 - 1.2 mg/dL 0.6  Alkaline Phos 38 - 126 U/L 210(H)  AST 15 - 41 U/L 13(L)  ALT 0 - 44 U/L 10    Discharge instruction: per After Visit Summary and "Baby and Me Booklet".  After visit meds:  Allergies as of 05/14/2019   No Known Allergies     Medication List    STOP taking these medications   norethindrone-ethinyl estradiol-iron 1.5-30 MG-MCG tablet Commonly known as: Junel FE 1.5/30     TAKE these medications   acetaminophen 325 MG tablet Commonly known as: Tylenol Take 2 tablets (650 mg total) by mouth every 4 (four) hours as needed (for pain scale < 4).   naproxen 500 MG tablet Commonly known as: NAPROSYN Take 1 tablet (500 mg total) by mouth 2 (two) times daily with a meal.   PRENATAL VITAMIN PO Take by mouth.       Diet: No evidence of DVT seen on physical exam.  Activity: Advance as tolerated. Pelvic rest for 6 weeks.   Outpatient follow up:1 week BP check, 6 week PP visit Follow up Appt:No future appointments. Follow up Visit:   Please schedule this patient for Postpartum visit in: 1 week with the following provider: Any provider For C/S patients schedule nurse incision check in weeks 2 weeks: no High risk pregnancy complicated by: IUGR, gHTN Delivery mode:  SVD Anticipated Birth Control:  other/unsure PP Procedures needed: BP check  Schedule Integrated [place] visit: no  Newborn Data: Live born female  Birth Weight:  4lb 10.6oz APGAR:  44, 9  Newborn Delivery   Birth date/time: 05/12/2019 08:43:00 Delivery type: Vaginal, Spontaneous      Baby Feeding: Breast Disposition:home with mother   05/14/2019 Matilde Haymaker, MD  I saw and evaluated the patient. I agree with the findings and the plan of care as documented in the resident's note. BP's WNL. Patient to discharge and room-in with baby. Nexplanon placed 8/28. Instructed to make visit at HD in next week to have a BP check. Patient does not own a cuff at home.   Barrington Ellison, MD [facility] Fellow, [facility] for [employer], [place]

## 2019-05-12 NOTE — Anesthesia Preprocedure Evaluation (Signed)
Anesthesia Evaluation  Patient identified by MRN, date of birth, ID band Patient awake    Reviewed: Allergy & Precautions, NPO status , Patient's Chart, lab work & pertinent test results  Airway Mallampati: II  TM Distance: >3 FB Neck ROM: Full    Dental  (+) Teeth Intact, Dental Advisory Given   Pulmonary neg pulmonary ROS,    Pulmonary exam normal breath sounds clear to auscultation       Cardiovascular negative cardio ROS Normal cardiovascular exam Rhythm:Regular Rate:Normal     Neuro/Psych  Headaches,    GI/Hepatic negative GI ROS, Neg liver ROS,   Endo/Other  Obesity   Renal/GU negative Renal ROS     Musculoskeletal negative musculoskeletal ROS (+)   Abdominal   Peds  Hematology negative hematology ROS (+) Plt 291k   Anesthesia Other Findings Day of surgery medications reviewed with the patient.  Reproductive/Obstetrics (+) Pregnancy                             Anesthesia Physical Anesthesia Plan  ASA: II  Anesthesia Plan: Epidural   Post-op Pain Management:    Induction:   PONV Risk Score and Plan: 2 and Treatment may vary due to age or medical condition  Airway Management Planned: Natural Airway  Additional Equipment:   Intra-op Plan:   Post-operative Plan:   Informed Consent: I have reviewed the patients History and Physical, chart, labs and discussed the procedure including the risks, benefits and alternatives for the proposed anesthesia with the patient or authorized representative who has indicated his/her understanding and acceptance.     Dental advisory given  Plan Discussed with:   Anesthesia Plan Comments: (Patient identified. Risks/Benefits/Options discussed with patient including but not limited to bleeding, infection, nerve damage, paralysis, failed block, incomplete pain control, headache, blood pressure changes, nausea, vomiting, reactions to  medication both or allergic, itching and postpartum back pain. Confirmed with bedside nurse the patient's most recent platelet count. Confirmed with patient that they are not currently taking any anticoagulation, have any bleeding history or any family history of bleeding disorders. Patient expressed understanding and wished to proceed. All questions were answered. )        Anesthesia Quick Evaluation

## 2019-05-12 NOTE — Lactation Note (Signed)
This note was copied from a Katie's chart. Lactation Consultation Note  Patient Name: Katie Ochoa JJHER'D Date: 05/12/2019 Reason for consult: Initial assessment;Primapara;1st time breastfeeding;Infant < 6lbs   1844 - 1913 - I conducted an initial breast feeding consult with Katie Ochoa. She has been breast and bottle feeding. Her preference is to do all breast feeding followed by providing some pumped milk by bottle. Formula is her last preference, but she is open to providing it.  Katie "[facility]" is under 6 lbs and has latched a few times since delivery. She states that he has recently refused breast and bottle, and it has been several hours since his last feeding. He was in quiet alert state upon entry. I assisted with waking him up and helping mom latch him in cross cradle hold on her left breast. He opens his mouth wide and latches and becomes sleepy. After a few attempts he latched with some rhythmical suckling sequences.  Katie Ochoa does not have a breast pump, but she has [facility].   I showed mom how to hand express. She repeats back well and has copious colostrum.  I shared the LPTI protocol due to Katie's birth weight and initiated her DEBP set up.   PLAN: - Breast feed 8-12 times/day on demand, waking to feed as needed. - Follow LPTI protocol due to birth weight and early term status - Pump following on the initiate setting for 15 minutes - Supplement 5-10 mls of pumped breast milk or formula following (Katie already receiving some formula by bottle) - Hand express to support milk production   Maternal Data Formula Feeding for Exclusion: No Has patient been taught Hand Expression?: Yes Does the patient have breastfeeding experience prior to this delivery?: No  Feeding Feeding Type: Breast Fed Nipple Type: Slow - flow  LATCH Score Latch: Grasps breast easily, tongue down, lips flanged, rhythmical sucking.  Audible Swallowing: A few with stimulation  Type of  Nipple: Everted at rest and after stimulation  Comfort (Breast/Nipple): Soft / non-tender  Hold (Positioning): Assistance needed to correctly position infant at breast and maintain latch.  LATCH Score: 8  Interventions Interventions: Breast feeding basics reviewed;Assisted with latch;Hand express;Breast compression;Adjust position;Support pillows;DEBP  Lactation Tools Discussed/Used Tools: Pump Breast pump type: Double-Electric Breast Pump WIC Program: Yes Pump Review: Setup, frequency, and cleaning;Milk Storage Initiated by:: hl Date initiated:: 05/12/19   Consult Status Consult Status: Follow-up Date: 05/13/19 Follow-up type: In-patient    Katie Ochoa 05/12/2019, 8:11 PM

## 2019-05-12 NOTE — Progress Notes (Signed)
Patient ID: Katie Ochoa, female   DOB: 2000-11-29, 18 y.o.   MRN: [id number] Katie Ochoa is a 18 y.o. G1P0 at [redacted]w[redacted]d admitted for induction of labor due to FGR 3%. Intrapartum HTN.  Subjective: Foley bulb out, hurting  Objective: BP (!) 143/77   Pulse 74   Temp 98.9 F (37.2 C) (Oral)   Resp 16   Ht 5\' 1"  (1.549 m)   Wt 78 kg   LMP 08/21/2018   SpO2 99%   BMI 32.50 kg/m  No intake/output data recorded.  FHT:  FHR: 135 bpm, variability: moderate,  accelerations:  Present,  decelerations:  Present mild variables UC:   q 2-39mins  SVE:   5/70/-1 Foley bulb out  Labs: Lab Results  Component Value Date   WBC 19.1 (H) 05/12/2019   HGB 12.5 05/12/2019   HCT 37.0 05/12/2019   MCV 83.9 05/12/2019   PLT 291 05/12/2019    Assessment / Plan: IOL d/t FGR, s/p cytotec and foley bulb, hurting, will start pitocin if needed  Labor: s/p ripening Fetal Wellbeing:  Category II Pain Control:  IV pain meds Pre-eclampsia: mildly elevated bp's, pre-e labs pending I/D:  GBS+ PCN Anticipated MOD:  NSVD  Roma Schanz CNM, WHNP-BC 05/12/2019, 0700

## 2019-05-13 NOTE — Progress Notes (Signed)
Post Partum Day 1 Subjective: no complaints, up ad lib, voiding and tolerating PO  Objective: Blood pressure 120/75, pulse (!) 101, temperature 98.3 F (36.8 C), temperature source Oral, resp. rate 18, height 5\' 1"  (1.549 m), weight 78 kg, last menstrual period 08/21/2018, SpO2 99 %, unknown if currently breastfeeding.  Physical Exam:  General: alert, cooperative and no distress Lochia: appropriate Uterine Fundus: firm Incision: NA DVT Evaluation: No evidence of DVT seen on physical exam.  Recent Labs    05/11/19 0825 05/12/19 0314  HGB 12.4 12.5  HCT 37.4 37.0    Assessment/Plan: Plan for discharge tomorrow, Breastfeeding/Bottle and Contraception Possible Nexplanon. Probably OP.    LOS: 2 days   [place] 05/13/2019, 12:30 PM

## 2019-05-13 NOTE — Lactation Note (Signed)
This note was copied from a baby's chart. Lactation Consultation Note  Patient Name: Katie Ochoa [id number]'V Date: 05/13/2019 Reason for consult: Follow-up assessment;Early term 37-38.6wks;Infant < 6lbs   Infant very sleepy at breast.  Will achieve latch but then falls immediately asleep.  Mom can hand exp., has not pumped at all today.  Pumped 2 times yesterday.  LC encouraged mom to pump in order to provide breastmilk.    Infant cueing Placed in multiple positions to feed, then would fall asleep.  LC discussed importance of supplementing infant with EBM or formula.  Mom states infant will not bottle feed; doesn't like the nipple.    LC placed 11 ml Neosure 22 cal in syringe with 5 fr. Tube.  Infant latched then LC inserted tube in corner of mouth when sucking.  Infant drew the colostrum on his own; no pushing of the syringe was necessary.  He was then burped by dad and LC wanted to assess ability to bottle feed.  Purple nipple used.  Infant took 8 ml of formula easily with bottle.    LC reviewed paced bottle feeding.  Supplement guidelines for infants age.  LC encouraged parents to not allow more than 30 minutes for entire feeding/ breast along with supplementation.    All questions answered.    Plan:   Mom will pump 8 times a day..every -2-3 hours during the day then can go 4 hours at night.  Feed back any EBM to infant then add formula if need to achieve supplement guideline amount.    Mom and dad were comfortable with paced bottle feeding and will call out for assistance with next bottle feed if necessary.     Maternal Data Has patient been taught Hand Expression?: Yes Does the patient have breastfeeding experience prior to this delivery?: No  Feeding Feeding Type: Breast Fed Nipple Type: Nfant Slow Flow (purple)  LATCH Score Latch: Repeated attempts needed to sustain latch, nipple held in mouth throughout feeding, stimulation needed to elicit sucking reflex.  Audible  Swallowing: None  Type of Nipple: Everted at rest and after stimulation  Comfort (Breast/Nipple): Soft / non-tender  Hold (Positioning): Assistance needed to correctly position infant at breast and maintain latch.  LATCH Score: 6  Interventions Interventions: Breast feeding basics reviewed;Assisted with latch;Support pillows;Position options;Skin to skin;Hand express;Adjust position  Lactation Tools Discussed/Used Tools: 52F feeding tube / Syringe   Consult Status Consult Status: Follow-up Date: 05/14/19 Follow-up type: In-patient    Ferne Coe [facility] 05/13/2019, 10:28 PM

## 2019-05-13 NOTE — Lactation Note (Signed)
This note was copied from a baby's chart. Lactation Consultation Note  Patient Name: Katie Ochoa XIHWT'U Date: 05/13/2019 Reason for consult: Follow-up assessment  LC Follow Up Visit:  RN request for follow up.  Attempted to visit with mother, however, she is asleep.  Will follow up later today; baby asleep in father's arms.     Consult Status Consult Status: Follow-up Date: 05/13/19 Follow-up type: In-patient    Joedy Eickhoff R Maynard David 05/13/2019, 5:05 PM

## 2019-05-14 DIAGNOSIS — Z30017 Encounter for initial prescription of implantable subdermal contraceptive: Secondary | ICD-10-CM

## 2019-05-14 MED ORDER — ACETAMINOPHEN 325 MG PO TABS: 650.0000 mg | ORAL_TABLET | ORAL | Status: DC | PRN

## 2019-05-14 MED ORDER — ETONOGESTREL 68 MG ~~LOC~~ IMPL
68.0000 mg | DRUG_IMPLANT | Freq: Once | SUBCUTANEOUS | Status: AC
  Administered 2019-05-14: 10:00:00 68 mg via SUBCUTANEOUS
  Filled 2019-05-14: qty 1

## 2019-05-14 MED ORDER — LIDOCAINE HCL 1 % IJ SOLN
0.0000 mL | Freq: Once | INTRAMUSCULAR | Status: AC | PRN
  Administered 2019-05-14: 10:00:00 20 mL via INTRADERMAL
  Filled 2019-05-14: qty 20

## 2019-05-14 NOTE — Discharge Instructions (Signed)

## 2019-05-14 NOTE — Progress Notes (Addendum)
Risks & benefits of Nexplanon discussed The nexplanon device was purchased and supplied by [facility]. Packaging instructions supplied to patient Consent form signed  No current facility-administered medications on file prior to encounter.    Current Outpatient Medications on File Prior to Encounter  Medication Sig Dispense Refill  . naproxen (NAPROSYN) 500 MG tablet Take 1 tablet (500 mg total) by mouth 2 (two) times daily with a meal. (Patient not taking: Reported on 11/26/2018) 30 tablet 1  . norethindrone-ethinyl estradiol-iron (JUNEL FE 1.5/30) 1.5-30 MG-MCG tablet Take 1 tablet by mouth daily. (Patient not taking: Reported on 11/26/2018) 1 Package 11  . Prenatal Vit-Fe Fumarate-FA (PRENATAL VITAMIN PO) Take by mouth.      The patient denies any allergies to anesthetics or antiseptics.  Patient Active Problem List   Diagnosis Date Noted  . IUGR (intrauterine growth restriction) affecting care of mother 05/11/2019  . Positive GBS test 05/11/2019  . Nonintractable headache 07/02/2018    Procedure: Pt was placed in supine position. Left arm was flexed at the elbow and externally rotated so that her wrist was parallel to her ear The medial epicondyle of the left arm was identified The insertions site was marked 8 cm proximal to the medial epicondyle The insertion site was cleaned with Betadine 1% lidocaine was injected just under the skin at the insertion site extending 4 cm proximally. The sterile preloaded disposable Nexaplanon applicator was removed from the sterile packaging The applicator needle was inserted at a 30 degree angle at 8 cm proximal to the medial epicondyle as marked The applicator was lowered to a horizontal position and advanced just under the skin for the full length of the needle The slider on the applicator was retracted fully while the applicator remained in the same position, then the applicator was removed. The implant was confirmed via palpation as being in  position The implant position was demonstrated to the patient Pressure dressing was applied to the patient.  The patient was instructed to removed the pressure dressing in 24 hrs.  The patient was advised to move slowly from a supine to an upright position  The patient denied any concerns or complaints  Matilde Haymaker, MD

## 2019-05-14 NOTE — Plan of Care (Signed)
Patient appropriate for discharge.

## 2019-05-15 ENCOUNTER — Ambulatory Visit: Payer: Self-pay

## 2019-05-15 NOTE — Lactation Note (Signed)
This note was copied from a baby's chart. Lactation Consultation Note  Patient Name: Boy Jaylia Pettus YQIHK'V Date: 05/15/2019   Baby 45 hours old.  < 5 lbs.  [redacted]w[redacted]d Mother recently pumped 30 ml.  Praised her for her efforts. Observed bottle feeding with purple nfant nipple.  Mother knew to pace feed. Reviewed volume guidelines increasing per day of life and as baby desires. Mother states baby breastfeed this morning at 0500 and she observed swallows. Encouraged her to continue to supplement and limit feedings to 30 min. Mother has an appt to get [facility] pump tomorrow. Provided a manual pump and demonstrated how to convert DEBP kit to double manual. Feed on demand approximately 8-12 times per day at least q 3hours.        Maternal Data    Feeding    LATCH Score                   Interventions    Lactation Tools Discussed/Used     Consult Status      BCarlye Grippe8/29/2020, 11:53 AM

## 2019-05-16 ENCOUNTER — Ambulatory Visit: Payer: Self-pay

## 2019-05-16 NOTE — Lactation Note (Addendum)
This note was copied from a baby's chart. Lactation Consultation Note  Patient Name: Katie Ochoa MVHQI'O Date: 05/16/2019   Mom has no questions or concerns. Mom declined a [facility] loaner & says she is content with using her hand pump until she goes to [facility] tomorrow.   Mom was informed to sanitize Nfant slow flow nipples once/day (and to wash after every use).   Mom says her breasts are filling, but her milk has not come in completely. The last time she pumped was last night & she was able to get a total of about 3 oz.   Mom is offering breast before formula feeding. I asked Mom to consider pumping more often to guarantee a better milk supply.   Infant has gained 85 g over the last 24 hours.  Matthias Hughs [facility] 05/16/2019, 8:51 AM

## 2019-07-19 ENCOUNTER — Ambulatory Visit (INDEPENDENT_AMBULATORY_CARE_PROVIDER_SITE_OTHER): Payer: Medicaid Other | Admitting: Licensed Clinical Social Worker

## 2019-07-19 ENCOUNTER — Other Ambulatory Visit: Payer: Self-pay

## 2019-07-19 DIAGNOSIS — F4322 Adjustment disorder with anxiety: Secondary | ICD-10-CM
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
## 2019-07-26 ENCOUNTER — Ambulatory Visit: Payer: Medicaid Other | Admitting: Licensed Clinical Social Worker

## 2019-07-26 NOTE — BH Specialist Note (Signed)
Pt chart opened for pre-visit planning, Attempting contact with pt x 2, Chart closed for admin reasons.

## 2020-11-09 ENCOUNTER — Ambulatory Visit: Payer: Medicaid Other | Admitting: Behavioral Health

## 2020-11-09 ENCOUNTER — Ambulatory Visit (INDEPENDENT_AMBULATORY_CARE_PROVIDER_SITE_OTHER): Payer: Medicaid Other | Admitting: Student

## 2020-11-09 ENCOUNTER — Encounter: Payer: Self-pay | Admitting: Student

## 2020-11-09 VITALS — BP 104/71 | HR 70 | Temp 98.1°F | Ht 61.0 in | Wt 156.1 lb

## 2020-11-09 DIAGNOSIS — F419 Anxiety disorder, unspecified: Secondary | ICD-10-CM | POA: Diagnosis present

## 2020-11-09 DIAGNOSIS — F32A Depression, unspecified: Secondary | ICD-10-CM

## 2020-11-09 DIAGNOSIS — F418 Other specified anxiety disorders: Secondary | ICD-10-CM

## 2020-11-09 DIAGNOSIS — F331 Major depressive disorder, recurrent, moderate: Secondary | ICD-10-CM | POA: Insufficient documentation

## 2020-11-09 MED ORDER — SERTRALINE HCL 100 MG PO TABS: 100.0000 mg | ORAL_TABLET | Freq: Every day | ORAL | 3 refills | Status: DC

## 2020-11-09 NOTE — Assessment & Plan Note (Signed)
Patient's PHQ9 elevated to 13. See problem "Anxiety" for charting of associated problem

## 2020-11-09 NOTE — Assessment & Plan Note (Addendum)
Patient's GAD 18 out of 21. Patient reports that following the delivery of her child approximately eighteen months ago, she had significant depression and anxiety which had resolved about four months following delivery. She was free of anxiety and depression for approximately ten months until her 65-month-old son at the time was diagnosed with retinoblastoma. Following this diagnosis, she has had significant symptoms of anxiety and depression for the past four months. She has been following with a telehealth provider who had prescribed the patient sertraline 50mg  daily which she had been tolerating well for the initial two weeks of treatment. As she did not notice significant improvement in her symptoms, she increased the dose of her medication to two tablets (100mg ) daily approximately one week ago. Patient's anxiety and depression are likely largely contributed to her current life stressors. Patient would benefit from continuation of her sertraline at current dosing and close follow-up with Dr. for further management of her condition. -Continue sertraline 100mg  daily (safe with breastfeeding) -Continue following closely with Dr. -Obtain CBC no diff, CMP, TSH, free T4 and total T3 to rule out associated cause of her anxiety/depression
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
## 2020-11-09 NOTE — Progress Notes (Signed)
   CC: New patient visit, anxiety/depression  HPI:  Katie Ochoa is a 20 y.o. female with past medical history significant for anxiety and depression who presents to clinic for new patient visit. Refer to problem list for charting of this encounter.  Past Medical History: Postpartum depression  Medications: Sertraline 50mg  x2 weeks then increased to 100mg  x1 week No over-the-counter medications No herbal medications or supplements  Allergies: No known drug allergies  Past Surgical History: No prior surgeries  Family History: Father, living - HTN Mother, living - HTN, T2DM, cervical cancer Two sisters, living - HTN One brother, living - GERD One son age 76 months, living - retinoblastoma follows with [facility]  Social History: Lives in [place] with partner and son. Consumes alcohol approximately once or twice per month. No recreational drug use.  Review of Systems:  Endorses palpitations, shortness of breath. Denies nausea, vomiting, abdominal pain, diarrhea, recent weight gain/loss, fevers, chills.  Physical Exam:  Vitals:   11/09/20 1027  BP: 104/71  Pulse: 70  Temp: 98.1 F (36.7 C)  TempSrc: Oral  SpO2: 100%  Weight: 156 lb 1.6 oz (70.8 kg)  Height: 5\' 1"  (1.549 m)   Physical Exam Constitutional:      General: She is not in acute distress.    Appearance: She is not ill-appearing.  HENT:     Head: Normocephalic and atraumatic.  Eyes:     Extraocular Movements: Extraocular movements intact.     Conjunctiva/sclera: Conjunctivae normal.     Pupils: Pupils are equal, round, and reactive to light.  Cardiovascular:     Rate and Rhythm: Normal rate and regular rhythm.     Pulses: Normal pulses.     Heart sounds: Normal heart sounds.  Pulmonary:     Effort: Pulmonary effort is normal.     Breath sounds: Normal breath sounds.  Abdominal:     General: Abdomen is flat. Bowel sounds are normal.     Palpations: Abdomen is soft.     Tenderness: There is no  abdominal tenderness.  Musculoskeletal:        General: Normal range of motion.     Right lower leg: No edema.     Left lower leg: No edema.  Skin:    General: Skin is warm and dry.     Capillary Refill: Capillary refill takes less than 2 seconds.  Neurological:     General: No focal deficit present.     Mental Status: She is alert. Mental status is at baseline.  Psychiatric:        Mood and Affect: Mood normal.        Behavior: Behavior normal.        Thought Content: Thought content normal.        Judgment: Judgment normal.      Assessment & Plan:   See Encounters Tab for problem based charting.  Patient discussed with Dr. Waterford

## 2020-11-09 NOTE — Patient Instructions (Addendum)
Katie Ochoa,  Fue un placer conocerte en la clnica hoy.  Para su ansiedad y depresin: le recomiendo enfticamente que contine siguiendo de cerca a Armed forces logistics/support/administrative officer en salud conductual, el Dr. Monna Fam. Adems, recargar su sertralina de 100 mg al da. Contine tomando [employer]. Tambin nos gustara recopilar algunos anlisis de laboratorio bsicos para evaluar si existe otra afeccin que contribuya a sus sntomas. Te llamar con [employer].  Si tiene alguna pregunta o inquietud, no dude en comunicarse.  Atentamente, Dr. Jasmine December, MD   [name],  It was a pleasure meeting you in clinic today.  For your anxiety and depression: I strongly recommend that you continue following closely with our behavioral health specialist, Dr. Monna Fam. Additionally, I will refill your sertraline 100mg  daily. Please continue taking this medication every day. We also would like to collect some basic labs to assess if there is another condition contributing to your symptoms. I will call you with the results.  If you have any questions or concerns, please do not hesitate to reach out.  Sincerely,  Dr. , MD

## 2020-11-10 LAB — CMP14 + ANION GAP
ALT: 14 IU/L (ref 0–32)
AST: 16 IU/L (ref 0–40)
Albumin/Globulin Ratio: 1.9 (ref 1.2–2.2)
Albumin: 4.2 g/dL (ref 3.9–5.0)
Alkaline Phosphatase: 84 IU/L (ref 42–106)
Anion Gap: 14 mmol/L (ref 10.0–18.0)
BUN/Creatinine Ratio: 17 (ref 9–23)
BUN: 9 mg/dL (ref 6–20)
Bilirubin Total: 0.3 mg/dL (ref 0.0–1.2)
CO2: 19 mmol/L — ABNORMAL LOW (ref 20–29)
Calcium: 9 mg/dL (ref 8.7–10.2)
Chloride: 105 mmol/L (ref 96–106)
Creatinine, Ser: 0.52 mg/dL — ABNORMAL LOW (ref 0.57–1.00)
GFR calc Af Amer: 159 mL/min/{1.73_m2} (ref >59)
GFR calc non Af Amer: 138 mL/min/{1.73_m2} (ref >59)
Globulin, Total: 2.2 g/dL (ref 1.5–4.5)
Glucose: 83 mg/dL (ref 65–99)
Potassium: 4.5 mmol/L (ref 3.5–5.2)
Sodium: 138 mmol/L (ref 134–144)
Total Protein: 6.4 g/dL (ref 6.0–8.5)

## 2020-11-10 LAB — CBC
Hematocrit: 40.1 % (ref 34.0–46.6)
Hemoglobin: 13.1 g/dL (ref 11.1–15.9)
MCH: 28.1 pg (ref 26.6–33.0)
MCHC: 32.7 g/dL (ref 31.5–35.7)
MCV: 86 fL (ref 79–97)
Platelets: 370 10*3/uL (ref 150–450)
RBC: 4.66 x10E6/uL (ref 3.77–5.28)
RDW: 13.4 % (ref 11.7–15.4)
WBC: 8.5 10*3/uL (ref 3.4–10.8)

## 2020-11-10 LAB — T3: T3, Total: 137 ng/dL (ref 71–180)

## 2020-11-10 LAB — TSH: TSH: 1.07 u[IU]/mL (ref 0.450–4.500)

## 2020-11-10 LAB — T4, FREE: Free T4: 1.09 ng/dL (ref 0.82–1.77)

## 2020-11-17 NOTE — Progress Notes (Signed)
Internal Medicine Clinic Attending  Case discussed with Dr. Johnson  At the time of the visit.  We reviewed the resident's history and exam and pertinent patient test results.  I agree with the assessment, diagnosis, and plan of care documented in the resident's note.  

## 2020-11-29 ENCOUNTER — Ambulatory Visit: Payer: Medicaid Other | Admitting: Behavioral Health

## 2020-11-29 ENCOUNTER — Encounter: Payer: Medicaid Other | Admitting: Internal Medicine

## 2021-03-20 ENCOUNTER — Encounter: Payer: Self-pay | Admitting: *Deleted

## 2021-04-11 ENCOUNTER — Telehealth: Payer: Self-pay | Admitting: *Deleted

## 2021-04-11 ENCOUNTER — Encounter: Payer: Medicaid Other | Admitting: Internal Medicine

## 2021-04-11 NOTE — Telephone Encounter (Signed)
Called patient, unable to leave message regarding missed appointment.

## 2021-04-30 ENCOUNTER — Emergency Department (HOSPITAL_COMMUNITY)
Admission: EM | Admit: 2021-04-30 | Discharge: 2021-05-01 | Disposition: A | Discharge status: Home / Self Care | Payer: Medicaid Other | Attending: Emergency Medicine | Admitting: Emergency Medicine

## 2021-04-30 DIAGNOSIS — Z8616 Personal history of COVID-19: Secondary | ICD-10-CM | POA: Insufficient documentation

## 2021-04-30 DIAGNOSIS — Z711 Person with feared health complaint in whom no diagnosis is made: Secondary | ICD-10-CM

## 2021-04-30 DIAGNOSIS — Z202 Contact with and (suspected) exposure to infections with a predominantly sexual mode of transmission: Secondary | ICD-10-CM | POA: Insufficient documentation

## 2021-04-30 DIAGNOSIS — N898 Other specified noninflammatory disorders of vagina: Secondary | ICD-10-CM | POA: Insufficient documentation

## 2021-04-30 NOTE — ED Provider Notes (Signed)
Emergency Medicine Provider Triage Evaluation Note  Katie Ochoa , a 20 y.o. female  was evaluated in triage.  Pt complains of vaginal pain.  The patient reports vaginal pain and itching accompanied by yellow-colored vaginal discharge for the last 5 days.  She reports associated dysuria.  She is also having blood in her urine, but she is unsure if it is urinary or vaginal.  No abdominal pain, nausea, vomiting, diarrhea, constipation, fever, chills.  She is sexually active and on birth control.  No concerns for pregnancy.  No concern for STIs.  Review of Systems  Positive: Dysuria, vaginal pain, vaginal itching, vaginal discharge Negative: Abdominal pain, fever, chills, nausea, vomiting, diarrhea, constipation, shortness of breath, chest pain, rash, sore throat  Physical Exam  BP 115/75 (BP Location: Right Arm)   Pulse 87   Temp 98.4 F (36.9 C) (Oral)   Resp 18   LMP 04/16/2021   SpO2 97%  Gen:   Awake, no distress   Resp:  Normal effort  MSK:   Moves extremities without difficulty  Other:  Abdomen is soft, nontender, nondistended  Medical Decision Making  Medically screening exam initiated at 11:03 PM.  Appropriate orders placed.  Katie Ochoa was informed that the remainder of the evaluation will be completed by another provider, this initial triage assessment does not replace that evaluation, and the importance of remaining in the ED until their evaluation is complete.  Labs and imaging of been ordered.  She will require further work-up and evaluation in the emergency department.   Barkley Boards, PA-C 04/30/21 2307    Cheryll Cockayne, MD 05/03/21 [phone]

## 2021-04-30 NOTE — ED Triage Notes (Signed)
Pt reports that she has been having pain, itching, burning and yellow discharge since Thursday

## 2021-05-01 LAB — URINALYSIS, ROUTINE W REFLEX MICROSCOPIC
Bacteria, UA: NONE SEEN
Bilirubin Urine: NEGATIVE
Glucose, UA: NEGATIVE mg/dL
Hgb urine dipstick: NEGATIVE
Ketones, ur: NEGATIVE mg/dL
Nitrite: NEGATIVE
Protein, ur: NEGATIVE mg/dL
Specific Gravity, Urine: 1.017 (ref 1.005–1.030)
pH: 5 (ref 5.0–8.0)

## 2021-05-01 LAB — HIV ANTIBODY (ROUTINE TESTING W REFLEX): HIV Screen 4th Generation wRfx: NONREACTIVE

## 2021-05-01 LAB — WET PREP, GENITAL
Clue Cells Wet Prep HPF POC: NONE SEEN
Sperm: NONE SEEN
Trich, Wet Prep: NONE SEEN
Yeast Wet Prep HPF POC: NONE SEEN

## 2021-05-01 LAB — PREGNANCY, URINE: Preg Test, Ur: NEGATIVE

## 2021-05-01 MED ORDER — DOXYCYCLINE HYCLATE 100 MG PO CAPS: 100.0000 mg | ORAL_CAPSULE | Freq: Two times a day (BID) | ORAL | 0 refills | Status: DC

## 2021-05-01 MED ORDER — STERILE WATER FOR INJECTION IJ SOLN
INTRAMUSCULAR | Status: AC
  Filled 2021-05-01: qty 10

## 2021-05-01 MED ORDER — FLUCONAZOLE 200 MG PO TABS: 200.0000 mg | ORAL_TABLET | Freq: Every day | ORAL | 0 refills | Status: AC

## 2021-05-01 MED ORDER — CEFTRIAXONE SODIUM 500 MG IJ SOLR
500.0000 mg | Freq: Once | INTRAMUSCULAR | Status: AC
  Administered 2021-05-01: 500 mg via INTRAMUSCULAR
  Filled 2021-05-01: qty 500

## 2021-05-01 NOTE — ED Provider Notes (Signed)
[facility] EMERGENCY DEPARTMENT Provider Note   CSN: [id number] Arrival date & time: 04/30/21  2254     History Chief Complaint  Patient presents with   Vaginal Discharge    Katie Ochoa is a 20 y.o. female.  Patient is a 20 year old female with a history of anxiety who is presenting today with complaints of vaginal itching, burning and discharge that has been present for 6 days.  She denies any dysuria or frequency.  She has had no vaginal bleeding.  The discharge is yellow and brown.  She is currently sexually active with 1 partner and does not use protection.  She has been without partner for 3 years.  She is unaware of him having any symptoms.  She does not have regular menses as she has Nexplanon.  She denies any abdominal pain, back pain, fever, nausea or vomiting.  She has been taking over-the-counter Azo and using yeast creams but it is not improving.  She has never had anything like this before.  The history is provided by the patient.  Vaginal Discharge     Past Medical History:  Diagnosis Date   Anemia    COVID-19    02/08/19    Patient Active Problem List   Diagnosis Date Noted   Depression 11/09/2020   Anxiety 11/09/2020   IUGR (intrauterine growth restriction) affecting care of mother 05/11/2019   Positive GBS test 05/11/2019    Past Surgical History:  Procedure Laterality Date   NO PAST SURGERIES       OB History     Gravida  1   Para  1   Term  1   Preterm      AB      Living  1      SAB      IAB      Ectopic      Multiple  0   Live Births  1           Family History  Problem Relation Age of Onset   Hypertension Mother    Diabetes Mother    Hypertension Father    Hypertension Sister     Social History   Tobacco Use   Smoking status: Never   Smokeless tobacco: Never  Vaping Use   Vaping Use: Never used  Substance Use Topics   Alcohol use: Yes    Alcohol/week: 1.0 standard drink    Types: 1  Cans of beer per week   Drug use: Never    Home Medications Prior to Admission medications   Medication Sig Start Date End Date Taking? Authorizing Provider  sertraline (ZOLOFT) 100 MG tablet Take 1 tablet (100 mg total) by mouth daily. 11/09/20 11/09/21  Roylene Reason, MD    Allergies    Patient has no known allergies.  Review of Systems   Review of Systems  Genitourinary:  Positive for vaginal discharge.  All other systems reviewed and are negative.  Physical Exam Updated Vital Signs BP 111/80 (BP Location: Left Arm)   Pulse 81   Temp 98.4 F (36.9 C) (Oral)   Resp 14   LMP 04/16/2021   SpO2 99%   Physical Exam Vitals and nursing note reviewed. Exam conducted with a chaperone present.  Constitutional:      General: She is not in acute distress.    Appearance: Normal appearance. She is normal weight.  HENT:     Head: Normocephalic and atraumatic.  Eyes:     Pupils:  Pupils are equal, round, and reactive to light.  Cardiovascular:     Rate and Rhythm: Normal rate and regular rhythm.     Pulses: Normal pulses.  Pulmonary:     Effort: Pulmonary effort is normal. No respiratory distress.     Breath sounds: Normal breath sounds. No wheezing.  Abdominal:     General: Abdomen is flat.     Palpations: Abdomen is soft.     Tenderness: There is no abdominal tenderness. There is no right CVA tenderness, left CVA tenderness, guarding or rebound.  Genitourinary:    Labia:        Right: No rash, tenderness or lesion.        Left: No rash, tenderness or lesion.      Vagina: Vaginal discharge present. No tenderness or lesions.     Cervix: Discharge present. No cervical motion tenderness, friability or cervical bleeding.     Uterus: Normal.      Adnexa: Right adnexa normal and left adnexa normal.     Comments: Yellow thick discharge. Musculoskeletal:     Cervical back: Normal range of motion and neck supple.     Right lower leg: No edema.     Left lower leg: No edema.   Skin:    General: Skin is warm and dry.     Capillary Refill: Capillary refill takes less than 2 seconds.  Neurological:     Mental Status: She is alert and oriented to person, place, and time. Mental status is at baseline.  Psychiatric:        Mood and Affect: Mood normal.        Behavior: Behavior normal.     ED Results / Procedures / Treatments   Labs (all labs ordered are listed, but only abnormal results are displayed) Labs Reviewed  URINALYSIS, ROUTINE W REFLEX MICROSCOPIC - Abnormal; Notable for the following components:      Result Value   APPearance HAZY (*)    Leukocytes,Ua LARGE (*)    All other components within normal limits  WET PREP, GENITAL  PREGNANCY, URINE  HIV ANTIBODY (ROUTINE TESTING W REFLEX)  RPR  GC/CHLAMYDIA PROBE AMP ([place]) NOT AT [facility]    EKG None  Radiology No results found.  Procedures Procedures   Medications Ordered in ED Medications - No data to display  ED Course  I have reviewed the triage vital signs and the nursing notes.  Pertinent labs & imaging results that were available during my care of the patient were reviewed by me and considered in my medical decision making (see chart for details).    MDM Rules/Calculators/A&P                           Patient presenting today with complaints of vaginal itching burning and discharge.  It has been present now for 6 days.  Currently sexually active with 1 partner for 3 years and does not use protection.  She does have Nexplanon and low suspicion for pregnancy at this time.  Concern for possible yeast infection versus STI. UA with large leukocytes but no bacteria or WBC and suspect this is from vaginal issue as pt has no urinary complaints. Wet prep with WBC only.  UPT neg.  Will treat with rocephin and doxycycline.  Also given diflucan for yeast and instructed to try yeast inserts.  MDM   Amount and/or Complexity of Data Reviewed Clinical lab tests: ordered and  reviewed Independent  visualization of images, tracings, or specimens: yes    Final Clinical Impression(s) / ED Diagnoses Final diagnoses:  Concern about STD in female without diagnosis  Vaginal discharge    Rx / DC Orders ED Discharge Orders          Ordered    doxycycline (VIBRAMYCIN) 100 MG capsule  2 times daily        05/01/21 1052    fluconazole (DIFLUCAN) 200 MG tablet  Daily        05/01/21 1052             Gwyneth Sprout, MD 05/01/21 1052

## 2021-05-01 NOTE — Discharge Instructions (Addendum)
You have been treated for gonorrhea and chlamydia just in case they come back positive.  If your cultures are negative you can stop taking the doxycycline.  You were also given a prescription for Diflucan for possible yeast infection.  Avoid any sexual contact for at least 1 week or until all your symptoms are resolved.  If you do test positive for a sexually transmitted infection your partner will need treatment.

## 2021-05-02 LAB — GC/CHLAMYDIA PROBE AMP (~~LOC~~) NOT AT ARMC
Chlamydia: NEGATIVE
Comment: NEGATIVE
Comment: NORMAL
Neisseria Gonorrhea: NEGATIVE

## 2021-05-02 LAB — RPR: RPR Ser Ql: NONREACTIVE

## 2021-07-10 ENCOUNTER — Encounter: Payer: Medicaid Other | Admitting: Obstetrics & Gynecology

## 2021-07-11 ENCOUNTER — Institutional Professional Consult (permissible substitution): Payer: Medicaid Other | Admitting: Behavioral Health

## 2021-07-11 ENCOUNTER — Telehealth: Payer: Self-pay | Admitting: Behavioral Health

## 2021-07-11 ENCOUNTER — Ambulatory Visit (INDEPENDENT_AMBULATORY_CARE_PROVIDER_SITE_OTHER): Payer: Medicaid Other | Admitting: Internal Medicine

## 2021-07-11 DIAGNOSIS — F321 Major depressive disorder, single episode, moderate: Secondary | ICD-10-CM

## 2021-07-11 MED ORDER — SERTRALINE HCL 100 MG PO TABS
100.0000 mg | ORAL_TABLET | Freq: Every day | ORAL | 3 refills | Status: DC
Start: 2021-07-11 — End: 2024-02-19

## 2021-07-11 NOTE — Telephone Encounter (Signed)
Contacted Pt after visit w/PCP Dr. Mcarthur Rossetti per request. Physician noted passive SI & PHQ-9 of 17: moderately severe today.  Connected w/Pt to secure mental health wellness & to schedule appt for [facility] telehealth session. Pt has Hx of relational issues w/Son's Fr Zettie [employer] & her own Hx of anx/dep for which she refilled Zoloft 100mg  today. Pt has dealt w/Son's retinoblastoma for extended time. Son has just completed chemotherapy & rads. Son wants to be a for Environmental education officer.  Secured Pt wellness w/brief conversation & words of encouragement/support for relational difficulties. Pt in good humor about cultural/gendered relational issues.   Pt prefers telehealth visit on Nov 7th @ 3:00pm.  Dr. Nov 9

## 2021-07-11 NOTE — Patient Instructions (Addendum)
Ms Katie Ochoa,  It was a pleasure seeing you in clinic. Today we discussed:   Depression/anxiety:  Refill for zoloft 100mg  daily sent to pharmacy. I will send referral to the therapist as requested.   If you have any questions or concerns, please call our clinic at [phone] between 9am-5pm and after hours call [phone] and ask for the internal medicine resident on call. If you feel you are having a medical emergency please call 911.   Thank you, we look forward to helping you remain healthy!

## 2021-07-11 NOTE — Assessment & Plan Note (Signed)
Patient has a history of depression and anxiety initially in setting of her son being diagnosed with retinoblastoma last year. He has undergone chemotherapy and radiation and notes that he completed therapies last month and is currently being monitored. She notes that approximately two months ago, she separated from her son's father. He has started seeing other people which is causing her depression to worsen. PHQ-9 score 17 with passive SI. She is currently out of her sertraline as well. Patient does not have active SI/HI at this time.  She is requesting referral to therapist and refill on her zoloft.   Plan: Referral to IBH placed at last visit, will follow up with Dr Monna Fam Refill for zoloft 100mg  daily sent to pharmacy

## 2021-07-11 NOTE — Progress Notes (Signed)
Established Patient Office Visit  Subjective:  Patient ID: Katie Ochoa, female    DOB: 13-Jan-2001  Age: 20 y.o. MRN: [id number]  CC: No chief complaint on file.   HPI Vinaya Sancho presents for follow up of depression and anxiety. She reports doing well overall; however, has been experiencing more interpersonal relationship issues for which she is requesting to speak with a therapist. No other concerns at this time.  Please see problem based charting for complete assessment and plan.  Past Medical History:  Diagnosis Date   Anemia    COVID-19    02/08/19    Past Surgical History:  Procedure Laterality Date   NO PAST SURGERIES      Family History  Problem Relation Age of Onset   Hypertension Mother    Diabetes Mother    Hypertension Father    Hypertension Sister     Social History   Socioeconomic History   Marital status: Single    Spouse name: Not on file   Number of children: Not on file   Years of education: Not on file   Highest education level: Not on file  Occupational History   Not on file  Tobacco Use   Smoking status: Never   Smokeless tobacco: Never  Vaping Use   Vaping Use: Never used  Substance and Sexual Activity   Alcohol use: Yes    Alcohol/week: 1.0 standard drink    Types: 1 Cans of beer per week   Drug use: Never   Sexual activity: Yes  Other Topics Concern   Not on file  Social History Narrative   Not on file   Social Determinants of Health   Financial Resource Strain: Not on file  Food Insecurity: Not on file  Transportation Needs: Not on file  Physical Activity: Not on file  Stress: Not on file  Social Connections: Not on file  Intimate Partner Violence: Not on file    Outpatient Medications Prior to Visit  Medication Sig Dispense Refill   doxycycline (VIBRAMYCIN) 100 MG capsule Take 1 capsule (100 mg total) by mouth 2 (two) times daily. 20 capsule 0   sertraline (ZOLOFT) 100 MG tablet Take 1 tablet (100 mg total) by mouth  daily. 90 tablet 3   No facility-administered medications prior to visit.    No Known Allergies  ROS Negative except as stated in HPI    Objective:     BP 116/79 (BP Location: Left Arm, Patient Position: Sitting, Cuff Size: Normal)   Pulse 81   Temp 98.4 F (36.9 C) (Oral)   Wt 176 lb (79.8 kg)   SpO2 99%   BMI 33.25 kg/m  Physical Exam  Constitutional: Appears well-developed and well-nourished. No distress.  HENT: Normocephalic and atraumatic Cardiovascular: RRR, S1 and S2 present, no m/r/g Distal pulses intact Respiratory: Effort is normal on room air Musculoskeletal: Normal bulk and tone.   Neurological: Is alert and oriented x4, no apparent focal deficits noted. Skin: Warm and dry.  No rash, erythema, lesions noted. Psychiatric: Normal mood and affect. Behavior is normal. Judgment and thought content normal. PHQ-9 score 17   Assessment & Plan:   Problem List Items Addressed This Visit       Other   Depression    Patient has a history of depression and anxiety initially in setting of her son being diagnosed with retinoblastoma last year. He has undergone chemotherapy and radiation and notes that he completed therapies last month and is currently being monitored. She  notes that approximately two months ago, she separated from her son's father. He has started seeing other people which is causing her depression to worsen. PHQ-9 score 17 with passive SI. She is currently out of her sertraline as well. Patient does not have active SI/HI at this time.  She is requesting referral to therapist and refill on her zoloft.   Plan: Referral to IBH placed at last visit, will follow up with Dr Monna Fam Refill for zoloft 100mg  daily sent to pharmacy      Relevant Medications   sertraline (ZOLOFT) 100 MG tablet    Meds ordered this encounter  Medications   sertraline (ZOLOFT) 100 MG tablet    Sig: Take 1 tablet (100 mg total) by mouth daily.    Dispense:  90 tablet     Refill:  3    Follow-up: Return in about 6 months (around 01/09/2022), or if symptoms worsen or fail to improve.    01/11/2022, MD

## 2021-07-23 ENCOUNTER — Ambulatory Visit: Payer: Medicaid Other | Admitting: Behavioral Health

## 2021-07-23 DIAGNOSIS — F419 Anxiety disorder, unspecified: Secondary | ICD-10-CM

## 2021-07-23 DIAGNOSIS — F331 Major depressive disorder, recurrent, moderate: Secondary | ICD-10-CM

## 2021-07-23 NOTE — BH Specialist Note (Signed)
Integrated Behavioral Health via Telemedicine Visit  07/23/2021 Katie Ochoa [id number]  Number of Integrated Behavioral Health visits: 2/6 Session Start time: 3:30pm  Session End time: 4:00am Total time: 30  Referring Provider: Ernest Haber Patient/Family location: Pt is out shopping & in her car in private [facility] Provider location: [facility] Office All persons participating in visit: Pt & Clinician Types of Service: Individual psychotherapy  I connected with [name] and/or [name]'s  self  via  Telephone or Video Enabled Telemedicine Application  (Video is Caregility application) and verified that I am speaking with the correct person using two identifiers. Discussed confidentiality:  2nd visit  I discussed the limitations of telemedicine and the availability of in person appointments.  Discussed there is a possibility of technology failure and discussed alternative modes of communication if that failure occurs.  I discussed that engaging in this telemedicine visit, they consent to the provision of behavioral healthcare and the services will be billed under their insurance.  Patient and/or legal guardian expressed understanding and consented to Telemedicine visit: Yes   Presenting Concerns: Patient and/or family reports the following symptoms/concerns: reduced anx/dep Sx due to upcoming travel w/her Bros & SIL to [place] for the Holidays; BF is not happy, trying to convince her to stay & travel w/him, but she is determined to go tomorrow.  Duration of problem: months; Severity of problem: mild  Patient and/or Family's Strengths/Protective Factors: Social connections, Social and Emotional competence, Concrete supports in place (healthy food, safe environments, etc.), Sense of purpose, and Physical Health (exercise, healthy diet, medication compliance, etc.)  Goals Addressed: Patient will:  Reduce symptoms of: anxiety, depression, and stress   Increase knowledge and/or  ability of: coping skills, healthy habits, and stress reduction   Demonstrate ability to: Increase healthy adjustment to current life circumstances  Progress towards Goals: Ongoing  Interventions: Interventions utilized:  Solution-Focused Strategies, Behavioral Activation, and Supportive Counseling Standardized Assessments completed:  screeners prn  Patient and/or Family Response: Pt receptive to call today, but still forgot even though reminded! Pt agrees to future sessions.  Assessment: Patient currently experiencing dec in anx/dep, although when she first scheduled w/Clinician her dep was elevated. Currently, Pt is planning & readying for travel. She feels improved. Her job is also secure as they are cooperative w/her @ [employer].  Patient may benefit from cont'd sessions for support & encouragement.  Plan: Follow up with behavioral health clinician on : 2-3 wks on telehealth for 30 min Behavioral recommendations: Keep your Notebook even while away, be safe & enjoy Family! Referral(s): Integrated [employer] (In Clinic)  I discussed the assessment and treatment plan with the patient and/or parent/guardian. They were provided an opportunity to ask questions and all were answered. They agreed with the plan and demonstrated an understanding of the instructions.   They were advised to call back or seek an in-person evaluation if the symptoms worsen or if the condition fails to improve as anticipated.  Deneise Lever, LMFT

## 2021-08-06 NOTE — Progress Notes (Signed)
Internal Medicine Clinic Attending ° °Case discussed with Dr. Aslam  At the time of the visit.  We reviewed the resident’s history and exam and pertinent patient test results.  I agree with the assessment, diagnosis, and plan of care documented in the resident’s note.  °

## 2021-09-16 NOTE — L&D Delivery Note (Cosign Needed Addendum)
Patient: Katie Ochoa MRN: [id number]  GBS status: negative, IAP not indicated  Patient is a 21 y.o. now [id number] s/p NSVD at [redacted]w[redacted]d, who was admitted for IOL 2/2 recommendation of MFM for oligohydramnios with AFI 2.3cm. IOL achieved with Cytotec, Foley balloon and Pitocin. SROM 2h 61m prior to delivery with clear fluid.    Delivery Note At 7:39 AM a  vigorous  female was delivered via Vaginal, Spontaneous (Presentation: Left Occiput Anterior).  APGAR: 9, 9; weight  .   Placenta status: Spontaneous, Intact.  Cord: 3 vessels with the following complications: None.    Head delivered DOA with restitution to LOA. No nuchal cord present. Shoulder and body delivered easily in usual fashion. Infant with spontaneous cry, placed on mother's abdomen, dried and bulb suctioned. Cord clamped x 2 after 1-minute delay, and cut by family member. Cord blood drawn. Placenta delivered spontaneously with gentle cord traction. Fundus firm with massage and Pitocin. Perineum inspected and found to have a small midline 1st degree perineal laceration which was repaired with 1 suture and found to be hemostatic as well as a hemostatic left periurethral which did not require repair.   Anesthesia: Epidural Episiotomy: None Lacerations: 1st degree;Periurethral Suture Repair: 2.0 vicryl Est. Blood Loss (mL):    Mom to postpartum.  Baby to Couplet care / Skin to Skin.  Theo Dills PGY3 Family Medicine Resident  [facility] [place] 08/03/2022, 8:00 AM   Fellow ATTESTATION  I was present and gloved for this delivery and agree with the above documentation in the resident's note except as below.  Celedonio Savage, MD [facility] [employer] [facility] (Faculty Practice) 08/03/2022, 9:24 PM

## 2022-05-21 ENCOUNTER — Ambulatory Visit (INDEPENDENT_AMBULATORY_CARE_PROVIDER_SITE_OTHER): Payer: Medicaid Other

## 2022-05-21 DIAGNOSIS — Z3480 Encounter for supervision of other normal pregnancy, unspecified trimester: Secondary | ICD-10-CM | POA: Insufficient documentation

## 2022-05-21 MED ORDER — BLOOD PRESSURE KIT DEVI: 1.0000 | 0 refills | Status: DC

## 2022-05-21 NOTE — Progress Notes (Cosign Needed)
New OB Intake  I connected with  Katie Ochoa on 05/21/22 at  2:10 PM EDT by telephone Video Visit and verified that I am speaking with the correct person using two identifiers. Nurse is located at [facility] and pt is located at home.  I discussed the limitations, risks, security and privacy concerns of performing an evaluation and management service by telephone and the availability of in person appointments. I also discussed with the patient that there may be a patient responsible charge related to this service. The patient expressed understanding and agreed to proceed.  I explained I am completing New OB Intake today. We discussed her EDD of 09/04/22 that is based on LMP of 11/28/21. Pt is G2/P1001. I reviewed her allergies, medications, Medical/Surgical/OB history, and appropriate screenings. I informed her of [facility] services. [facility] information placed in AVS. Based on history, this is a/an  pregnancy uncomplicated .   Patient Active Problem List   Diagnosis Date Noted   Depression 11/09/2020   Anxiety 11/09/2020   IUGR (intrauterine growth restriction) affecting care of mother 05/11/2019   Positive GBS test 05/11/2019    Concerns addressed today  Delivery Plans Plans to deliver at [facility]. Patient given information for [facility] Healthy Baby website for more information about [facility]. Patient is not interested in water birth. Offered upcoming OB visit with CNM to discuss further.  MyChart/Babyscripts MyChart access verified. I explained pt will have some visits in office and some virtually. Babyscripts instructions given and order placed. Patient verifies receipt of registration text/e-mail. Account successfully created and app downloaded.  Blood Pressure Cuff/Weight Scale Blood pressure cuff ordered for patient to pick-up from [employer]. Explained after first prenatal appt pt will check weekly and document in Babyscripts. Patient does  have weight scale. Weight  scale ordered for patient to pick up from [employer].   Anatomy US Explained first scheduled [place] will be around 19 weeks. Anatomy US ordered. Date TBD  Labs Discussed Avelina Laine genetic screening with patient. Would like both Panorama and Horizon drawn at new OB visit. Routine prenatal labs needed.  Covid Vaccine Patient has not covid vaccine.   Is patient a CenteringPregnancy candidate?  Not a Candidate Declined due to  Not a candidate due to OtherLate to Care Centering Patient" indicated on sticky note  Social Determinants of Health Food Insecurity: Patient denies food insecurity. WIC Referral: Patient is interested in referral to [facility].  Transportation: Patient denies transportation needs. Childcare: Discussed no children allowed at ultrasound appointments. Offered childcare services; patient declines childcare services at this time.  First visit review I reviewed new OB appt with pt. I explained she will have a provider visit that includes re-establishing care for her pregnancy. Care started in [place]. Patient to come fill out a records release for records. Explained pt will be seen by Raelyn Mora at first visit; encounter routed to appropriate provider. Explained that patient will be seen by pregnancy navigator following visit with provider.   Hamilton Capri, RN 05/21/2022  1:46 PM

## 2022-06-12 ENCOUNTER — Ambulatory Visit: Payer: Medicaid Other | Attending: Obstetrics and Gynecology

## 2022-06-12 ENCOUNTER — Ambulatory Visit: Payer: Medicaid Other

## 2022-06-27 ENCOUNTER — Other Ambulatory Visit (HOSPITAL_COMMUNITY)
Admission: RE | Admit: 2022-06-27 | Discharge: 2022-06-27 | Disposition: A | Discharge status: Home / Self Care | Payer: Medicaid Other | Source: Ambulatory Visit | Attending: Obstetrics and Gynecology | Admitting: Obstetrics and Gynecology

## 2022-06-27 ENCOUNTER — Encounter: Payer: Self-pay | Admitting: Obstetrics and Gynecology

## 2022-06-27 ENCOUNTER — Ambulatory Visit (INDEPENDENT_AMBULATORY_CARE_PROVIDER_SITE_OTHER): Payer: Medicaid Other | Admitting: Obstetrics and Gynecology

## 2022-06-27 VITALS — BP 117/77 | HR 105 | Wt 175.0 lb

## 2022-06-27 DIAGNOSIS — N760 Acute vaginitis: Secondary | ICD-10-CM

## 2022-06-27 DIAGNOSIS — Z3480 Encounter for supervision of other normal pregnancy, unspecified trimester: Secondary | ICD-10-CM

## 2022-06-27 DIAGNOSIS — O0933 Supervision of pregnancy with insufficient antenatal care, third trimester: Secondary | ICD-10-CM

## 2022-06-27 DIAGNOSIS — B9689 Other specified bacterial agents as the cause of diseases classified elsewhere: Secondary | ICD-10-CM

## 2022-06-27 DIAGNOSIS — Z3A30 30 weeks gestation of pregnancy: Secondary | ICD-10-CM | POA: Diagnosis not present

## 2022-06-27 DIAGNOSIS — Z3483 Encounter for supervision of other normal pregnancy, third trimester: Secondary | ICD-10-CM | POA: Diagnosis not present

## 2022-06-27 NOTE — Progress Notes (Addendum)
INITIAL OBSTETRICAL VISIT Patient name: Katie Ochoa MRN [id number]  Date of birth: 09/11/2001 Chief Complaint:   Initial Prenatal Visit  History of Present Illness:   Katie Ochoa is a 21 y.o. G58P1001 Hispanic female at [redacted]w[redacted]d by LMP with an Estimated Date of Delivery: 09/04/22 being seen today for her initial obstetrical visit.  Her obstetrical history is significant for  limited Prenatal care . She was receiving [facility] in [place] even though she moved here for her son's chemo treatments. She reports she would travel back to [place] for her appointments, but has been unable to go back to [place] since [redacted] weeks gestation. This is an unplanned pregnancy. She and the father of the baby (FOB) do not live together. She has a support system that consists of family/friends. Today she reports no complaints.   Patient's last menstrual period was 11/28/2021. Last pap never had Review of Systems:   Pertinent items are noted in HPI Denies cramping/contractions, leakage of fluid, vaginal bleeding, abnormal vaginal discharge w/ itching/odor/irritation, headaches, visual changes, shortness of breath, chest pain, abdominal pain, severe nausea/vomiting, or problems with urination or bowel movements unless otherwise stated above.  Pertinent History Reviewed:  Reviewed past medical,surgical, social, obstetrical and family history.  Reviewed problem list, medications and allergies. OB History  Gravida Para Term Preterm AB Living  2 1 1     1   SAB IAB Ectopic Multiple Live Births        0 1    # Outcome Date GA Lbr Len/2nd Weight Sex Delivery Anes PTL Lv  2 Current           1 Term 05/12/19 [redacted]w[redacted]d 01:31 / 00:18 4 lb 10.6 oz (2.115 kg) M Vag-Spont EPI  LIV   Physical Assessment:   Vitals:   06/27/22 1411  BP: 117/77  Pulse: (!) 105  Weight: 175 lb (79.4 kg)  Body mass index is 33.07 kg/m.       Physical Examination:  General appearance - well appearing, and in no distress  Mental status - alert,  oriented to person, place, and time  Psych:  She has a normal mood and affect  Skin - warm and dry, normal color, no suspicious lesions noted  Chest - effort normal, all lung fields clear to auscultation bilaterally  Heart - normal rate and regular rhythm  Abdomen - soft, nontender  Extremities:  No swelling or varicosities noted  Pelvic - VULVA: normal appearing vulva with no masses, tenderness or lesions  VAGINA: normal appearing vagina with normal color and discharge, no lesions.   CERVIX: normal appearing cervix without discharge or lesions, no CMT  Thin prep pap is done with reflex HR HPV cotesting   FHTs by doppler: 152 bpm  Assessment & Plan:  1) Low-Risk Pregnancy G2P1001 at [redacted]w[redacted]d with an Estimated Date of Delivery: 09/04/22   2) Initial OB visit - Welcomed to practice and introduced self to patient in addition to discussing other advanced practice providers that she may be seeing at this practice - Congratulated patient - Anticipatory guidance on upcoming appointments - Educated on [place] and pregnancy and the integration of virtual appointments  - Educated on babyscripts app- patient reports she has not received email, encouraged to look in spam folder and to call office if she still has not received email - patient verbalizes understanding    3) Supervision of other normal pregnancy, antepartum - Meets criteria for bASA, but not started d/t gestational age.  4) [redacted] weeks gestation of  pregnancy   Meds: No orders of the defined types were placed in this encounter.   Initial labs obtained Continue prenatal vitamins Reviewed n/v relief measures and warning s/s to report Reviewed recommended weight gain based on pre-gravid BMI Encouraged well-balanced diet Genetic Screening discussed: ordered Cystic fibrosis, SMA, Fragile X screening discussed ordered The nature of [place] with multiple MDs and other Advanced Practice Providers was  explained to patient; also emphasized that residents, students are part of our team.  Discussed optimized OB schedule and video visits. Advised can have an in-office visit whenever she feels she needs to be seen.  Does not have own BP cuff. BP cuff Rx faxed today. Explained to patient that BP will be mailed to her house. Check BP weekly, report BP >140/90. Advised to call during normal business hours and there is an after-hours nurse line available.   Indications for ASA therapy (per uptodate) One of the following: Previous pregnancy with preeclampsia, especially early onset and with an adverse outcome? no Multifetal gestation? no Chronic hypertension?  no Type 1 or 2 diabetes mellitus? no Chronic kidney disease? no Autoimmune disease (antiphospholipid syndrome, systemic lupus erythematosus)? no   Two or more of the following: Nulliparity? no Obesity (body mass index >30 kg/m2)?yes Family history of preeclampsia in mother or sister? no Age ?35 years? no Sociodemographic characteristics (African American race, low socioeconomic level)?yes Personal risk factors (eg, previous pregnancy with low birth weight or small for gestational age infant, previous adverse pregnancy outcome [eg, stillbirth], interval >10 years between pregnancies)? no    Follow-up: Return in about 1 week (around 07/04/2022) for 2 hr GTT ONLY.   No orders of the defined types were placed in this encounter.   Laury Deep MSN, CNM 06/27/2022 2:58 PM

## 2022-06-27 NOTE — Progress Notes (Signed)
New OB late to care, declined FLU and TDap vaccines.

## 2022-06-27 NOTE — Patient Instructions (Signed)
Glucose Tolerance Test Instructions - Expect to be here for 2 HOURS!! You must fast after midnight without anything to eat or drink (except for water). You will have fasting blood drawn, drink the glucola drink (flavor choices: orange or fruit punch), wait in the waiting room to have blood drawn at 1 hour and then 2 hours after finishing glucola drink.  Oral Glucose Tolerance Test During Pregnancy Why am I having this test? The oral glucose tolerance test (OGTT) is done to check how your body processes blood sugar (glucose). This is one of several tests used to diagnose diabetes that develops during pregnancy (gestational diabetes mellitus). Gestational diabetes is a short-term form of diabetes that some women develop while they are pregnant. It usually occurs during the second trimester of pregnancy and goes away after delivery. Testing, or screening, for gestational diabetes usually occurs at weeks 24-28 of pregnancy. You may have the OGTT test after having a 1-hour glucose screening test if the results from that test indicate that you may have gestational diabetes. This test may also be needed if: You have a history of gestational diabetes. There is a history of giving birth to very large babies or of losing pregnancies (having stillbirths). You have signs and symptoms of diabetes, such as: Changes in your eyesight. Tingling or numbness in your hands or feet. Changes in hunger, thirst, and urination, and these are not explained by your pregnancy. What is being tested? This test measures the amount of glucose in your blood at different times during a period of 3 hours. This shows how well your body can process glucose. What kind of sample is taken?  Blood samples are required for this test. They are usually collected by inserting a needle into a blood vessel. How do I prepare for this test? For 3 days before your test, eat normally. Have plenty of carbohydrate-rich foods. Follow instructions  from your health care provider about: Eating or drinking restrictions on the day of the test. You may be asked not to eat or drink anything other than water (to fast) starting 8-10 hours before the test. Changing or stopping your regular medicines. Some medicines may interfere with this test. Tell a health care provider about: All medicines you are taking, including vitamins, herbs, eye drops, creams, and over-the-counter medicines. Any blood disorders you have. Any surgeries you have had. Any medical conditions you have. What happens during the test? First, your blood glucose will be measured. This is referred to as your fasting blood glucose because you fasted before the test. Then, you will drink a glucose solution that contains a certain amount of glucose. Your blood glucose will be measured again 1, 2, and 3 hours after you drink the solution. This test takes about 3 hours to complete. You will need to stay at the testing location during this time. During the testing period: Do not eat or drink anything other than the glucose solution. Do not exercise. Do not use any products that contain nicotine or tobacco, such as cigarettes, e-cigarettes, and chewing tobacco. These can affect your test results. If you need help quitting, ask your health care provider. The testing procedure may vary among health care providers and hospitals. How are the results reported? Your results will be reported as milligrams of glucose per deciliter of blood (mg/dL) or millimoles per liter (mmol/L). There is more than one source for screening and diagnosis reference values used to diagnose gestational diabetes. Your health care provider will compare your results to  normal values that were established after testing a large group of people (reference values). Reference values may vary among labs and hospitals. For this test (Carpenter-Coustan), reference values are: Fasting: 95 mg/dL (5.3 mmol/L). 1 hour: 180 mg/dL  (10.0 mmol/L). 2 hour: 155 mg/dL (8.6 mmol/L). 3 hour: 140 mg/dL (7.8 mmol/L). What do the results mean? Results below the reference values are considered normal. If two or more of your blood glucose levels are at or above the reference values, you may be diagnosed with gestational diabetes. If only one level is high, your health care provider may suggest repeat testing or other tests to confirm a diagnosis. Talk with your health care provider about what your results mean. Questions to ask your health care provider Ask your health care provider, or the department that is doing the test: When will my results be ready? How will I get my results? What are my treatment options? What other tests do I need? What are my next steps? Summary The oral glucose tolerance test (OGTT) is one of several tests used to diagnose diabetes that develops during pregnancy (gestational diabetes mellitus). Gestational diabetes is a short-term form of diabetes that some women develop while they are pregnant. You may have the OGTT test after having a 1-hour glucose screening test if the results from that test show that you may have gestational diabetes. You may also have this test if you have any symptoms or risk factors for this type of diabetes. Talk with your health care provider about what your results mean. This information is not intended to replace advice given to you by your health care provider. Make sure you discuss any questions you have with your health care provider. Document Revised: 02/10/2020 Document Reviewed: 02/10/2020 Elsevier Patient Education  [place].

## 2022-06-28 LAB — CBC/D/PLT+RPR+RH+ABO+RUBIGG...
Antibody Screen: NEGATIVE
Basophils Absolute: 0 10*3/uL (ref 0.0–0.2)
Basos: 0 %
EOS (ABSOLUTE): 0.2 10*3/uL (ref 0.0–0.4)
Eos: 1 %
HCV Ab: NONREACTIVE
HIV Screen 4th Generation wRfx: NONREACTIVE
Hematocrit: 33.6 % — ABNORMAL LOW (ref 34.0–46.6)
Hemoglobin: 11.3 g/dL (ref 11.1–15.9)
Hepatitis B Surface Ag: NEGATIVE
Immature Grans (Abs): 0.1 10*3/uL (ref 0.0–0.1)
Immature Granulocytes: 1 %
Lymphocytes Absolute: 1.9 10*3/uL (ref 0.7–3.1)
Lymphs: 15 %
MCH: 27.8 pg (ref 26.6–33.0)
MCHC: 33.6 g/dL (ref 31.5–35.7)
MCV: 83 fL (ref 79–97)
Monocytes Absolute: 0.6 10*3/uL (ref 0.1–0.9)
Monocytes: 5 %
Neutrophils Absolute: 10 10*3/uL — ABNORMAL HIGH (ref 1.4–7.0)
Neutrophils: 78 %
Platelets: 359 10*3/uL (ref 150–450)
RBC: 4.07 x10E6/uL (ref 3.77–5.28)
RDW: 12.9 % (ref 11.7–15.4)
RPR Ser Ql: NONREACTIVE
Rh Factor: POSITIVE
Rubella Antibodies, IGG: 4.71 index (ref >0.99)
WBC: 12.8 10*3/uL — ABNORMAL HIGH (ref 3.4–10.8)

## 2022-06-28 LAB — CERVICOVAGINAL ANCILLARY ONLY
Bacterial Vaginitis (gardnerella): POSITIVE — AB
Candida Glabrata: NEGATIVE
Candida Vaginitis: NEGATIVE
Chlamydia: NEGATIVE
Comment: NEGATIVE
Comment: NEGATIVE
Comment: NEGATIVE
Comment: NEGATIVE
Comment: NEGATIVE
Comment: NORMAL
Neisseria Gonorrhea: NEGATIVE
Trichomonas: NEGATIVE

## 2022-06-28 LAB — HEMOGLOBIN A1C
Est. average glucose Bld gHb Est-mCnc: 123 mg/dL
Hgb A1c MFr Bld: 5.9 % — ABNORMAL HIGH (ref 4.8–5.6)

## 2022-06-28 LAB — HCV INTERPRETATION

## 2022-07-01 ENCOUNTER — Encounter: Payer: Self-pay | Admitting: Obstetrics and Gynecology

## 2022-07-01 MED ORDER — METRONIDAZOLE 500 MG PO TABS: 500.0000 mg | ORAL_TABLET | Freq: Two times a day (BID) | ORAL | 0 refills | Status: DC

## 2022-07-01 NOTE — Addendum Note (Signed)
Addended by: Graceann Congress on: 07/01/2022 09:02 PM   Modules accepted: Orders

## 2022-07-02 ENCOUNTER — Other Ambulatory Visit: Payer: Self-pay

## 2022-07-02 DIAGNOSIS — B9689 Other specified bacterial agents as the cause of diseases classified elsewhere: Secondary | ICD-10-CM

## 2022-07-02 LAB — CYTOLOGY - PAP

## 2022-07-02 LAB — CULTURE, OB URINE

## 2022-07-02 LAB — URINE CULTURE, OB REFLEX

## 2022-07-02 MED ORDER — METRONIDAZOLE 0.75 % VA GEL: 1.0000 | Freq: Every day | VAGINAL | 1 refills | Status: DC

## 2022-07-03 ENCOUNTER — Encounter: Payer: Self-pay | Admitting: Obstetrics and Gynecology

## 2022-07-03 LAB — PANORAMA PRENATAL TEST FULL PANEL:PANORAMA TEST PLUS 5 ADDITIONAL MICRODELETIONS: FETAL FRACTION: 25.2

## 2022-07-04 ENCOUNTER — Other Ambulatory Visit: Payer: Self-pay | Admitting: *Deleted

## 2022-07-04 LAB — HORIZON CUSTOM: REPORT SUMMARY: NEGATIVE

## 2022-07-05 ENCOUNTER — Ambulatory Visit (INDEPENDENT_AMBULATORY_CARE_PROVIDER_SITE_OTHER): Payer: Medicaid Other

## 2022-07-05 ENCOUNTER — Other Ambulatory Visit: Payer: Medicaid Other

## 2022-07-05 VITALS — BP 120/75 | HR 109 | Wt 175.2 lb

## 2022-07-05 DIAGNOSIS — O2340 Unspecified infection of urinary tract in pregnancy, unspecified trimester: Secondary | ICD-10-CM | POA: Insufficient documentation

## 2022-07-05 DIAGNOSIS — Z3A31 31 weeks gestation of pregnancy: Secondary | ICD-10-CM

## 2022-07-05 DIAGNOSIS — O2343 Unspecified infection of urinary tract in pregnancy, third trimester: Secondary | ICD-10-CM

## 2022-07-05 DIAGNOSIS — Z3483 Encounter for supervision of other normal pregnancy, third trimester: Secondary | ICD-10-CM

## 2022-07-05 DIAGNOSIS — Z3480 Encounter for supervision of other normal pregnancy, unspecified trimester: Secondary | ICD-10-CM

## 2022-07-05 MED ORDER — CEFADROXIL 500 MG PO CAPS: 500.0000 mg | ORAL_CAPSULE | Freq: Two times a day (BID) | ORAL | 0 refills | Status: DC

## 2022-07-05 NOTE — Progress Notes (Signed)
Patient presents for ROB visit. No concerns at this time.   

## 2022-07-05 NOTE — Progress Notes (Signed)
2 ur    LOW-RISK PREGNANCY OFFICE VISIT  Patient name: Katie Ochoa MRN [id number]  Date of birth: 2000/10/19 Chief Complaint:   Routine Prenatal Visit  Subjective:   Katie Ochoa is a 21 y.o. G26P1001 female at [redacted]w[redacted]d with an Estimated Date of Delivery: 09/04/22 being seen today for ongoing management of a low-risk pregnancy aeb has IUGR (intrauterine growth restriction) affecting care of mother; Positive GBS test; Depression; Anxiety; Supervision of other normal pregnancy, antepartum; and Urinary tract infection affecting pregnancy on their problem list.  Patient presents today, alone, with no complaints.  Patient endorses fetal movement. Patient denies abdominal cramping or contractions.  Patient denies vaginal concerns including abnormal discharge, leaking of fluid, and bleeding.  Contractions: Not present. Vag. Bleeding: None.  Movement: Present.  Reviewed past medical,surgical, social, obstetrical and family history as well as problem list, medications and allergies.  Objective   Vitals:   07/05/22 0938  BP: 120/75  Pulse: (!) 109  Weight: 175 lb 3.2 oz (79.5 kg)  Body mass index is 33.1 kg/m.  Total Weight Gain:15 lb 3.2 oz (6.895 kg)         Physical Examination:   General appearance: Well appearing, and in no distress  Mental status: Alert, oriented to person, place, and time  Skin: Warm & dry  Cardiovascular: Normal heart rate noted  Respiratory: Normal respiratory effort, no distress  Abdomen: Soft, gravid, nontender, AGA with    Pelvic: Cervical exam deferred           Extremities: Edema: None  Fetal Status: Fetal Heart Rate (bpm): 140  Movement: Present   No results found for this or any previous visit (from the past 24 hour(s)).  Assessment & Plan:  Low-risk pregnancy of a 21 y.o., G2P1001 at [redacted]w[redacted]d with an Estimated Date of Delivery: 09/04/22   1. Supervision of other normal pregnancy, antepartum -Anticipatory guidance for upcoming appts. -Patient to schedule  next appt in 2 weeks for an in-person visit.  2. [redacted] weeks gestation of pregnancy -Doing well. -Completed GTT today. -Scheduled for [place] on Monday  3. Urinary tract infection affecting pregnancy -States she was treated at her previous MD, but is unsure of the treatment. -Discussed treatment today with Duricef and taking for 10 days. -Plan for retest in 4 weeks.       Meds: No orders of the defined types were placed in this encounter.  Labs/procedures today:  Lab Orders  No laboratory test(s) ordered today     Reviewed: Preterm labor symptoms and general obstetric precautions including but not limited to vaginal bleeding, contractions, leaking of fluid and fetal movement were reviewed in detail with the patient.  All questions were answered.  Follow-up: No follow-ups on file.  No orders of the defined types were placed in this encounter.  Maryann Conners MSN, CNM 07/05/2022

## 2022-07-06 LAB — GLUCOSE TOLERANCE, 2 HOURS W/ 1HR
Glucose, 1 hour: 169 mg/dL (ref 70–179)
Glucose, 2 hour: 90 mg/dL (ref 70–152)
Glucose, Fasting: 83 mg/dL (ref 70–91)

## 2022-07-08 ENCOUNTER — Ambulatory Visit: Payer: Medicaid Other

## 2022-07-19 ENCOUNTER — Other Ambulatory Visit (HOSPITAL_COMMUNITY)
Admission: RE | Admit: 2022-07-19 | Discharge: 2022-07-19 | Disposition: A | Discharge status: Home / Self Care | Payer: Medicaid Other | Source: Ambulatory Visit | Attending: Student | Admitting: Student

## 2022-07-19 ENCOUNTER — Encounter: Payer: Self-pay | Admitting: Student

## 2022-07-19 ENCOUNTER — Ambulatory Visit (INDEPENDENT_AMBULATORY_CARE_PROVIDER_SITE_OTHER): Payer: Medicaid Other | Admitting: Student

## 2022-07-19 VITALS — BP 118/79 | HR 101 | Wt 175.0 lb

## 2022-07-19 DIAGNOSIS — N898 Other specified noninflammatory disorders of vagina: Secondary | ICD-10-CM | POA: Diagnosis present

## 2022-07-19 DIAGNOSIS — O26893 Other specified pregnancy related conditions, third trimester: Secondary | ICD-10-CM | POA: Diagnosis present

## 2022-07-19 DIAGNOSIS — O2343 Unspecified infection of urinary tract in pregnancy, third trimester: Secondary | ICD-10-CM

## 2022-07-19 DIAGNOSIS — Z3A35 35 weeks gestation of pregnancy: Secondary | ICD-10-CM

## 2022-07-19 DIAGNOSIS — F321 Major depressive disorder, single episode, moderate: Secondary | ICD-10-CM

## 2022-07-19 DIAGNOSIS — O0933 Supervision of pregnancy with insufficient antenatal care, third trimester: Secondary | ICD-10-CM

## 2022-07-19 DIAGNOSIS — Z3480 Encounter for supervision of other normal pregnancy, unspecified trimester: Secondary | ICD-10-CM

## 2022-07-19 DIAGNOSIS — Z3483 Encounter for supervision of other normal pregnancy, third trimester: Secondary | ICD-10-CM

## 2022-07-19 DIAGNOSIS — Z3493 Encounter for supervision of normal pregnancy, unspecified, third trimester: Secondary | ICD-10-CM

## 2022-07-19 DIAGNOSIS — O2340 Unspecified infection of urinary tract in pregnancy, unspecified trimester: Secondary | ICD-10-CM

## 2022-07-19 NOTE — Progress Notes (Signed)
Pt presents for ROB visit. Pt reports having white discharge that "feels like sand". LMP was 11/16/21 instead of 11/28/21 as listed in her chart. Pt would like to schedule an induction since all her family is back in [place]. No other concerns at this time.

## 2022-07-19 NOTE — Progress Notes (Signed)
Pt presents for ROB visit.  

## 2022-07-19 NOTE — Progress Notes (Signed)
   PRENATAL VISIT NOTE  Subjective:  Katie Ochoa is a 21 y.o. G2P1001 at [redacted]w[redacted]d being seen today for ongoing prenatal care.  She is currently monitored for the following issues for this low-risk pregnancy and has IUGR (intrauterine growth restriction) affecting care of mother; Positive GBS test; Depression; Anxiety; Supervision of other normal pregnancy, antepartum; and Urinary tract infection affecting pregnancy on their problem list.  Patient reports vaginal irritation.  Contractions: Not present. Vag. Bleeding: None.  Movement: Present. Denies leaking of fluid.   The following portions of the patient's history were reviewed and updated as appropriate: allergies, current medications, past family history, past medical history, past social history, past surgical history and problem list.   Objective:   Vitals:   07/19/22 0914  BP: 118/79  Pulse: (!) 101  Weight: 175 lb (79.4 kg)    Fetal Status: Fetal Heart Rate (bpm): 139 Fundal Height: 35 cm Movement: Present     General:  Alert, oriented and cooperative. Patient is in no acute distress.  Skin: Skin is warm and dry. No rash noted.   Cardiovascular: Normal heart rate noted  Respiratory: Normal respiratory effort, no problems with respiration noted  Abdomen: Soft, gravid, appropriate for gestational age.  Pain/Pressure: Present     Pelvic: Cervical exam deferred        Extremities: Normal range of motion.  Edema: None  Mental Status: Normal mood and affect. Normal behavior. Normal judgment and thought content.   Assessment and Plan:  Pregnancy: G2P1001 at [redacted]w[redacted]d 1. Supervision of other normal pregnancy, antepartum - Doing well overall, frequent and vigorous fetal movement, FHT WDL  2. [redacted] weeks gestation of pregnancy - GBS swab at next visit - Expressed desire for eIOL. Discussed safety recommendations and waiting until at least [redacted] weeks gestation for elective induction of labor. Encouraged to continue discussion at follow-up  visits after Anatomy US and closer to term.   3. Limited prenatal care in third trimester - Transferred from OB/GYN care in [place]  4. Current moderate episode of major depressive disorder, unspecified whether recurrent ([place]) - Situational stressors are present. Referred to care manager team for support and assistance  5. Urinary tract infection affecting pregnancy - Asymptomatic, completed abx treatment  6. Vaginal discharge during pregnancy in third trimester - Declined STI testing at this visit - Cervicovaginal ancillary only  7. Pregnancy with uncertain dates in third trimester - LMP in chart incorrect per patient. There is no antenatal imaging on file currently. Patient has detailed [place] scheduled this upcoming Monday, 07/22/22.     Preterm labor symptoms and general obstetric precautions including but not limited to vaginal bleeding, contractions, leaking of fluid and fetal movement were reviewed in detail with the patient. Please refer to After Visit Summary for other counseling recommendations.   Return in about 1 week (around 07/26/2022) for IN-PERSON, LOB/GBS.  Future Appointments  Date Time Provider [place]  07/22/2022  7:30 AM [facility] NURSE [facility]  07/22/2022  7:45 AM [facility] US6 [facility]  07/30/2022  8:35 AM Griffin Basil, MD [place] None  08/06/2022  8:35 AM Griffin Basil, MD [place] None  08/13/2022  8:35 AM Gavin Pound, CNM [facility] None  08/20/2022  8:35 AM Leftwich-Kirby, Kathie Dike, CNM [facility] None    Johnston Ebbs, NP

## 2022-07-22 ENCOUNTER — Other Ambulatory Visit: Payer: Self-pay | Admitting: Obstetrics and Gynecology

## 2022-07-22 ENCOUNTER — Other Ambulatory Visit: Payer: Self-pay | Admitting: *Deleted

## 2022-07-22 ENCOUNTER — Ambulatory Visit: Payer: Medicaid Other | Admitting: *Deleted

## 2022-07-22 ENCOUNTER — Ambulatory Visit: Payer: Medicaid Other | Attending: Obstetrics and Gynecology

## 2022-07-22 ENCOUNTER — Encounter: Payer: Self-pay | Admitting: Obstetrics and Gynecology

## 2022-07-22 ENCOUNTER — Encounter: Payer: Self-pay | Admitting: *Deleted

## 2022-07-22 VITALS — BP 108/66 | HR 89

## 2022-07-22 DIAGNOSIS — Z3A35 35 weeks gestation of pregnancy: Secondary | ICD-10-CM | POA: Insufficient documentation

## 2022-07-22 DIAGNOSIS — O99213 Obesity complicating pregnancy, third trimester: Secondary | ICD-10-CM | POA: Diagnosis not present

## 2022-07-22 DIAGNOSIS — Z3689 Encounter for other specified antenatal screening: Secondary | ICD-10-CM | POA: Insufficient documentation

## 2022-07-22 DIAGNOSIS — Z3480 Encounter for supervision of other normal pregnancy, unspecified trimester: Secondary | ICD-10-CM

## 2022-07-22 DIAGNOSIS — Z363 Encounter for antenatal screening for malformations: Secondary | ICD-10-CM | POA: Insufficient documentation

## 2022-07-22 DIAGNOSIS — O0933 Supervision of pregnancy with insufficient antenatal care, third trimester: Secondary | ICD-10-CM

## 2022-07-22 DIAGNOSIS — O4100X0 Oligohydramnios, unspecified trimester, not applicable or unspecified: Secondary | ICD-10-CM

## 2022-07-22 LAB — CERVICOVAGINAL ANCILLARY ONLY
Bacterial Vaginitis (gardnerella): NEGATIVE
Candida Glabrata: NEGATIVE
Candida Vaginitis: NEGATIVE
Chlamydia: NEGATIVE
Comment: NEGATIVE
Comment: NEGATIVE
Comment: NEGATIVE
Comment: NEGATIVE
Comment: NEGATIVE
Comment: NORMAL
Neisseria Gonorrhea: NEGATIVE
Trichomonas: NEGATIVE

## 2022-07-23 ENCOUNTER — Telehealth: Payer: Self-pay | Admitting: *Deleted

## 2022-07-23 NOTE — Telephone Encounter (Signed)
TC from pt reporting pubic area pressure that is constant and worse with working. Also concerned for low AFI reported by MFM yesterday. Denies regular UC's or LOF. Encouraged pt to purchase and wear maternity support belt. List of resources given. Encouraged pt to seek care in [facility] for regular UC's or LOF. Reassured pt that AFI was not low enough for MFM to recommend intervention and that a recheck in 1 week is appropriate.

## 2022-07-30 ENCOUNTER — Ambulatory Visit (INDEPENDENT_AMBULATORY_CARE_PROVIDER_SITE_OTHER): Payer: Medicaid Other | Admitting: Obstetrics and Gynecology

## 2022-07-30 VITALS — BP 112/73 | HR 86 | Wt 174.4 lb

## 2022-07-30 DIAGNOSIS — Z3490 Encounter for supervision of normal pregnancy, unspecified, unspecified trimester: Secondary | ICD-10-CM

## 2022-07-30 DIAGNOSIS — Z3A36 36 weeks gestation of pregnancy: Secondary | ICD-10-CM

## 2022-07-30 DIAGNOSIS — Z3480 Encounter for supervision of other normal pregnancy, unspecified trimester: Secondary | ICD-10-CM

## 2022-07-30 DIAGNOSIS — Z3493 Encounter for supervision of normal pregnancy, unspecified, third trimester: Secondary | ICD-10-CM | POA: Diagnosis not present

## 2022-07-30 DIAGNOSIS — Z3483 Encounter for supervision of other normal pregnancy, third trimester: Secondary | ICD-10-CM | POA: Diagnosis not present

## 2022-07-30 NOTE — Progress Notes (Signed)
Patient presents for ROB and GBS. Patient wants to discuss elective induction for 39 weeks. Desires cervix check.

## 2022-07-30 NOTE — Progress Notes (Signed)
   PRENATAL VISIT NOTE  Subjective:  Analynn Daum is a 21 y.o. G2P1001 at [redacted]w[redacted]d being seen today for ongoing prenatal care.  She is currently monitored for the following issues for this low-risk pregnancy and has IUGR (intrauterine growth restriction) affecting care of mother; Positive GBS test; Depression; Anxiety; Supervision of other normal pregnancy, antepartum; and Urinary tract infection affecting pregnancy on their problem list.  Patient doing well with no acute concerns today. She reports no complaints.  Contractions: Not present. Vag. Bleeding: None.  Movement: Present. Denies leaking of fluid.   The following portions of the patient's history were reviewed and updated as appropriate: allergies, current medications, past family history, past medical history, past social history, past surgical history and problem list. Problem list updated.  Objective:   Vitals:   07/30/22 0843  BP: 112/73  Pulse: 86  Weight: 174 lb 6.4 oz (79.1 kg)    Fetal Status: Fetal Heart Rate (bpm): 130 Fundal Height: 35 cm Movement: Present     General:  Alert, oriented and cooperative. Patient is in no acute distress.  Skin: Skin is warm and dry. No rash noted.   Cardiovascular: Normal heart rate noted  Respiratory: Normal respiratory effort, no problems with respiration noted  Abdomen: Soft, gravid, appropriate for gestational age.  Pain/Pressure: Present     Pelvic: Cervical exam performed Dilation: 1 Effacement (%): 60 Station: -3  Extremities: Normal range of motion.  Edema: None  Mental Status:  Normal mood and affect. Normal behavior. Normal judgment and thought content.   Assessment and Plan:  Pregnancy: G2P1001 at [redacted]w[redacted]d  1. [redacted] weeks gestation of pregnancy  - Strep Gp B NAA  2. Supervision of other normal pregnancy, antepartum Chart reviewed, pt desires elective IOL at 39 weeks, pt did have IUGR at some point but appears to have resolved  - Strep Gp B NAA  Preterm labor symptoms and  general obstetric precautions including but not limited to vaginal bleeding, contractions, leaking of fluid and fetal movement were reviewed in detail with the patient.  Please refer to After Visit Summary for other counseling recommendations.   Return in about 1 week (around 08/06/2022) for ROB, in person.   Mariel Aloe, MD Faculty Attending Center for [facility]

## 2022-08-01 ENCOUNTER — Ambulatory Visit: Payer: Medicaid Other | Admitting: *Deleted

## 2022-08-01 ENCOUNTER — Inpatient Hospital Stay (HOSPITAL_COMMUNITY)
Admission: AD | Admit: 2022-08-01 | Discharge: 2022-08-01 | Disposition: A | Discharge status: Home / Self Care | Payer: Medicaid Other | Attending: Obstetrics and Gynecology | Admitting: Obstetrics and Gynecology

## 2022-08-01 ENCOUNTER — Encounter (HOSPITAL_COMMUNITY): Payer: Self-pay | Admitting: Obstetrics and Gynecology

## 2022-08-01 ENCOUNTER — Ambulatory Visit: Payer: Medicaid Other | Attending: Obstetrics and Gynecology

## 2022-08-01 ENCOUNTER — Ambulatory Visit: Payer: Medicaid Other | Attending: Maternal & Fetal Medicine | Admitting: Maternal & Fetal Medicine

## 2022-08-01 VITALS — BP 122/66 | HR 70

## 2022-08-01 DIAGNOSIS — Z0371 Encounter for suspected problem with amniotic cavity and membrane ruled out: Secondary | ICD-10-CM | POA: Diagnosis present

## 2022-08-01 DIAGNOSIS — Z3A37 37 weeks gestation of pregnancy: Secondary | ICD-10-CM | POA: Diagnosis not present

## 2022-08-01 DIAGNOSIS — O4100X0 Oligohydramnios, unspecified trimester, not applicable or unspecified: Secondary | ICD-10-CM | POA: Diagnosis present

## 2022-08-01 DIAGNOSIS — O0933 Supervision of pregnancy with insufficient antenatal care, third trimester: Secondary | ICD-10-CM | POA: Diagnosis not present

## 2022-08-01 DIAGNOSIS — E669 Obesity, unspecified: Secondary | ICD-10-CM | POA: Diagnosis not present

## 2022-08-01 DIAGNOSIS — O4103X0 Oligohydramnios, third trimester, not applicable or unspecified: Secondary | ICD-10-CM | POA: Insufficient documentation

## 2022-08-01 DIAGNOSIS — O99213 Obesity complicating pregnancy, third trimester: Secondary | ICD-10-CM | POA: Insufficient documentation

## 2022-08-01 DIAGNOSIS — O4290 Premature rupture of membranes, unspecified as to length of time between rupture and onset of labor, unspecified weeks of gestation: Secondary | ICD-10-CM

## 2022-08-01 DIAGNOSIS — Z3480 Encounter for supervision of other normal pregnancy, unspecified trimester: Secondary | ICD-10-CM

## 2022-08-01 DIAGNOSIS — Z3A36 36 weeks gestation of pregnancy: Secondary | ICD-10-CM

## 2022-08-01 LAB — POCT FERN TEST: POCT Fern Test: NEGATIVE

## 2022-08-01 LAB — STREP GP B NAA: Strep Gp B NAA: NEGATIVE

## 2022-08-01 LAB — AMNISURE RUPTURE OF MEMBRANE (ROM) NOT AT ARMC: Amnisure ROM: NEGATIVE

## 2022-08-01 NOTE — MAU Note (Signed)
...  Katie Ochoa is a 21 y.o. at [redacted]w[redacted]d here in MAU reporting: Sent over from MFM per Dr. Grace Bushy for persistent Oligohydramnios to rule out rupture. She reports she has been experiencing increase wetness in her underwear but denies feeling any leaking of fluids. She reports the fluid is clear and does not have any odors. She is also endorsing labial pain that feels like pressure with movement and walking. Denies VB. +FM.  GBS-.  Onset of complaint: 4 days Pain score: 8/10 labia - with movement  FHT: 131 doppler Lab orders placed from triage:  Crist Fat

## 2022-08-01 NOTE — MAU Provider Note (Signed)
History     CSN: [id number]  Arrival date and time: 08/01/22 1136   None     Chief Complaint  Patient presents with   Sent over from Office   Rupture of Membranes   Patient presenting for evaluation for possible ROM.  She was seen at MFM today for evaluation of oligohydramnios.  She did report to MFM provider that she was having occasional leaking of fluid and was unsure if it was urine or rupture of membranes.  Denies any vaginal bleeding, contractions and reports good fetal movement.  Spoke with MFM provider who requested induction at 37 weeks for persistent oligohydramnios.    OB History     Gravida  2   Para  1   Term  1   Preterm      AB      Living  1      SAB      IAB      Ectopic      Multiple  0   Live Births  1           Past Medical History:  Diagnosis Date   Anemia    COVID-19    02/08/19    Past Surgical History:  Procedure Laterality Date   NO PAST SURGERIES      Family History  Problem Relation Age of Onset   Hypertension Mother    Diabetes Mother    Hypertension Father    Hypertension Sister     Social History   Tobacco Use   Smoking status: Never   Smokeless tobacco: Never  Vaping Use   Vaping Use: Never used  Substance Use Topics   Alcohol use: Not Currently    Alcohol/week: 1.0 standard drink of alcohol    Types: 1 Cans of beer per week   Drug use: Never    Allergies: No Known Allergies  Medications Prior to Admission  Medication Sig Dispense Refill Last Dose   Prenatal Vit-Fe Fumarate-FA (MULTIVITAMIN-PRENATAL) 27-0.8 MG TABS tablet Take 1 tablet by mouth daily at 12 noon.   07/31/2022   Blood Pressure Monitoring (BLOOD PRESSURE KIT) DEVI 1 kit by Does not apply route once a week. 1 each 0    sertraline (ZOLOFT) 100 MG tablet Take 1 tablet (100 mg total) by mouth daily. (Patient not taking: Reported on 05/21/2022) 90 tablet 3     Review of Systems  Constitutional:  Negative for chills, diaphoresis and  fever.  HENT:  Negative for congestion and rhinorrhea.   Eyes:  Negative for visual disturbance.  Respiratory:  Negative for shortness of breath.   Cardiovascular:  Negative for chest pain.  Gastrointestinal:  Negative for abdominal pain, diarrhea, nausea and vomiting.  Endocrine: Negative for polyuria.  Genitourinary:  Positive for vaginal discharge (occasional leaking fluid, unsure if urine or something else). Negative for decreased urine volume, difficulty urinating and dysuria.  Neurological:  Negative for dizziness, light-headedness and headaches.   Physical Exam   Blood pressure 116/66, pulse 80, temperature 98 F (36.7 C), temperature source Oral, resp. rate 15, height 5' 1" (1.549 m), weight 79.6 kg, last menstrual period 11/16/2021, SpO2 94 %, unknown if currently breastfeeding.  Physical Exam Vitals and nursing note reviewed.  Constitutional:      General: She is not in acute distress.    Appearance: Normal appearance.  HENT:     Head: Normocephalic and atraumatic.     Nose: Nose normal.     Mouth/Throat:       Mouth: Mucous membranes are moist.  Eyes:     Extraocular Movements: Extraocular movements intact.     Pupils: Pupils are equal, round, and reactive to light.  Cardiovascular:     Rate and Rhythm: Normal rate.     Pulses: Normal pulses.  Pulmonary:     Effort: Pulmonary effort is normal.  Abdominal:     Comments: Gravid   Musculoskeletal:        General: Normal range of motion.     Cervical back: Normal range of motion.  Skin:    General: Skin is warm and dry.     Capillary Refill: Capillary refill takes less than 2 seconds.  Neurological:     General: No focal deficit present.     Mental Status: She is alert.  Psychiatric:        Mood and Affect: Mood normal.     MAU Course  Procedures  MDM Maryann Alar test AmniSure NST   Assessment and Plan  Katie Ochoa is a 21 y/o G2P1 presenting for possible ROM.  Leaking fluid Patient reports intermittent  episodes of leaking of fluid more commonly when walking and it comes in spurts.  No gushing. - Fern test negative - AmniSure negative - Patient requires induction of labor at 37 weeks for oligohydramnios.  Spoke with on-call attending as well as charge nurse and induction scheduled for tomorrow. - Discussed strict return precautions - No further questions or concerns - Patient discharged home.  Concepcion Living 08/01/2022, 2:20 PM

## 2022-08-01 NOTE — Progress Notes (Signed)
MFM Brief Note  Katie Ochoa is here for antenatal testing at 62 w 6 day at the request of Helaine Chess, CNM  She was seen with a prior exam of 2. 5 cm of amniotic fluid.   She reports some increased "wetness" But denies a gush of fluid.   Antenatal testing performed given maternal oligohydramnios. The biophysical profile was 8/8 with good fetal movement and oligohydramnios MVP/AFI of 2.3 cm  I discussed todays finding of persistent oligohydramnios. I recommend she go to the MAU to r/o membrane rupture.   In addition, I recommend delivery at 37 weeks.   I discussed the plan of care with Dr. Towanda Octave who is aware of the plan   I spent 20 minutes with > 50% in face to face consultation.  Katie Tiffany J. Brahm Barbeau,MD

## 2022-08-01 NOTE — Discharge Instructions (Signed)
It was a pleasure taking care of you today.  It does not look like your water is broken.  You do need induction of labor at 37 weeks for your low fluid levels.  This will be tomorrow.  Someone will call you to tell you to come into the hospital.  If you have any bleeding, gush of fluid, decreased fetal movement, contractions between now and then please return for further evaluation.  I hope you have a wonderful day!

## 2022-08-02 ENCOUNTER — Inpatient Hospital Stay (HOSPITAL_COMMUNITY)
Admission: RE | Admit: 2022-08-02 | Discharge: 2022-08-04 | DRG: 807 | Disposition: A | Discharge status: Home / Self Care | Payer: Medicaid Other | Attending: Obstetrics & Gynecology | Admitting: Obstetrics & Gynecology

## 2022-08-02 ENCOUNTER — Encounter (HOSPITAL_COMMUNITY): Payer: Self-pay | Admitting: Obstetrics and Gynecology

## 2022-08-02 ENCOUNTER — Inpatient Hospital Stay (HOSPITAL_COMMUNITY): Payer: Medicaid Other

## 2022-08-02 DIAGNOSIS — O99214 Obesity complicating childbirth: Secondary | ICD-10-CM | POA: Diagnosis present

## 2022-08-02 DIAGNOSIS — O4103X0 Oligohydramnios, third trimester, not applicable or unspecified: Secondary | ICD-10-CM | POA: Diagnosis present

## 2022-08-02 DIAGNOSIS — Z6791 Unspecified blood type, Rh negative: Secondary | ICD-10-CM | POA: Diagnosis not present

## 2022-08-02 DIAGNOSIS — Z3A37 37 weeks gestation of pregnancy: Secondary | ICD-10-CM

## 2022-08-02 DIAGNOSIS — O4100X0 Oligohydramnios, unspecified trimester, not applicable or unspecified: Secondary | ICD-10-CM | POA: Diagnosis present

## 2022-08-02 DIAGNOSIS — Z8616 Personal history of COVID-19: Secondary | ICD-10-CM

## 2022-08-02 DIAGNOSIS — Z3490 Encounter for supervision of normal pregnancy, unspecified, unspecified trimester: Secondary | ICD-10-CM | POA: Diagnosis present

## 2022-08-02 DIAGNOSIS — O9902 Anemia complicating childbirth: Secondary | ICD-10-CM | POA: Diagnosis present

## 2022-08-02 DIAGNOSIS — O26893 Other specified pregnancy related conditions, third trimester: Secondary | ICD-10-CM | POA: Diagnosis present

## 2022-08-02 LAB — TYPE AND SCREEN
ABO/RH(D): O POS
Antibody Screen: NEGATIVE

## 2022-08-02 LAB — CBC
HCT: 31.9 % — ABNORMAL LOW (ref 36.0–46.0)
Hemoglobin: 10.8 g/dL — ABNORMAL LOW (ref 12.0–15.0)
MCH: 26.3 pg (ref 26.0–34.0)
MCHC: 33.9 g/dL (ref 30.0–36.0)
MCV: 77.6 fL — ABNORMAL LOW (ref 80.0–100.0)
Platelets: 321 10*3/uL (ref 150–400)
RBC: 4.11 MIL/uL (ref 3.87–5.11)
RDW: 13.4 % (ref 11.5–15.5)
WBC: 12.5 10*3/uL — ABNORMAL HIGH (ref 4.0–10.5)
nRBC: 0 % (ref 0.0–0.2)

## 2022-08-02 MED ORDER — LACTATED RINGERS IV SOLN: INTRAVENOUS | Status: DC

## 2022-08-02 MED ORDER — DIPHENHYDRAMINE HCL 50 MG/ML IJ SOLN: 12.5000 mg | INTRAMUSCULAR | Status: DC | PRN

## 2022-08-02 MED ORDER — TERBUTALINE SULFATE 1 MG/ML IJ SOLN: 0.2500 mg | Freq: Once | INTRAMUSCULAR | Status: DC | PRN

## 2022-08-02 MED ORDER — OXYTOCIN BOLUS FROM INFUSION
333.0000 mL | Freq: Once | INTRAVENOUS | Status: AC
  Administered 2022-08-03: 333 mL via INTRAVENOUS

## 2022-08-02 MED ORDER — MISOPROSTOL 25 MCG QUARTER TABLET
25.0000 ug | ORAL_TABLET | Freq: Once | ORAL | Status: AC
  Administered 2022-08-02: 25 ug via VAGINAL
  Filled 2022-08-02: qty 1

## 2022-08-02 MED ORDER — ACETAMINOPHEN 325 MG PO TABS: 650.0000 mg | ORAL_TABLET | ORAL | Status: DC | PRN

## 2022-08-02 MED ORDER — PHENYLEPHRINE 80 MCG/ML (10ML) SYRINGE FOR IV PUSH (FOR BLOOD PRESSURE SUPPORT): 80.0000 ug | PREFILLED_SYRINGE | INTRAVENOUS | Status: DC | PRN

## 2022-08-02 MED ORDER — SOD CITRATE-CITRIC ACID 500-334 MG/5ML PO SOLN: 30.0000 mL | ORAL | Status: DC | PRN

## 2022-08-02 MED ORDER — FENTANYL-BUPIVACAINE-NACL 0.5-0.125-0.9 MG/250ML-% EP SOLN
12.0000 mL/h | EPIDURAL | Status: DC | PRN
  Administered 2022-08-03: 12 mL/h via EPIDURAL
  Filled 2022-08-02: qty 250

## 2022-08-02 MED ORDER — OXYTOCIN-SODIUM CHLORIDE 30-0.9 UT/500ML-% IV SOLN: 2.5000 [IU]/h | INTRAVENOUS | Status: DC

## 2022-08-02 MED ORDER — EPHEDRINE 5 MG/ML INJ: 10.0000 mg | INTRAVENOUS | Status: DC | PRN

## 2022-08-02 MED ORDER — LIDOCAINE HCL (PF) 1 % IJ SOLN: 30.0000 mL | INTRAMUSCULAR | Status: DC | PRN

## 2022-08-02 MED ORDER — MISOPROSTOL 50MCG HALF TABLET
50.0000 ug | ORAL_TABLET | Freq: Once | ORAL | Status: AC
  Administered 2022-08-02: 50 ug via ORAL
  Filled 2022-08-02: qty 1

## 2022-08-02 MED ORDER — ONDANSETRON HCL 4 MG/2ML IJ SOLN
4.0000 mg | Freq: Four times a day (QID) | INTRAMUSCULAR | Status: DC | PRN
  Administered 2022-08-03: 4 mg via INTRAVENOUS
  Filled 2022-08-02: qty 2

## 2022-08-02 MED ORDER — OXYTOCIN-SODIUM CHLORIDE 30-0.9 UT/500ML-% IV SOLN
1.0000 m[IU]/min | INTRAVENOUS | Status: DC
  Filled 2022-08-02: qty 500

## 2022-08-02 MED ORDER — MISOPROSTOL 50MCG HALF TABLET
50.0000 ug | ORAL_TABLET | Freq: Once | ORAL | Status: AC
  Administered 2022-08-02: 50 ug via BUCCAL
  Filled 2022-08-02: qty 1

## 2022-08-02 MED ORDER — LACTATED RINGERS IV SOLN
500.0000 mL | Freq: Once | INTRAVENOUS | Status: AC
  Administered 2022-08-03: 500 mL via INTRAVENOUS

## 2022-08-02 MED ORDER — FENTANYL CITRATE (PF) 100 MCG/2ML IJ SOLN
50.0000 ug | INTRAMUSCULAR | Status: DC | PRN
  Administered 2022-08-02 – 2022-08-03 (×2): 100 ug via INTRAVENOUS
  Filled 2022-08-02 (×2): qty 2

## 2022-08-02 MED ORDER — LACTATED RINGERS IV SOLN
500.0000 mL | INTRAVENOUS | Status: DC | PRN
  Administered 2022-08-03: 500 mL via INTRAVENOUS

## 2022-08-02 MED ORDER — OXYCODONE-ACETAMINOPHEN 5-325 MG PO TABS: 1.0000 | ORAL_TABLET | ORAL | Status: DC | PRN

## 2022-08-02 NOTE — Progress Notes (Signed)
Labor Progress Note Katie Ochoa is a 21 y.o. G2P1001 at [redacted]w[redacted]d presented for IOL 2/2 recommendation of MFM for oligohydramnios with AFI 2.3 cm.  S: Sleeping when I enter the room.  No acute distress  O:  BP (!) 100/50   Pulse 75   Temp 98.6 F (37 C) (Oral)   Resp 18   LMP 11/16/2021 (Exact Date)  EFM: 130 s/moderate variability/+accels, no decels  CVE: Dilation: 1 Effacement (%): 70 Cervical Position: Posterior Station: -2 Presentation: Vertex Exam by:: Dr. Nobie Putnam  A&P: 21 y.o. G2P1001 [redacted]w[redacted]d presenting for IOL 2/2 oligohydramnios. #Labor: Progressing well.  Patient has already had 50 mcg of Cytotec x1 and 25x2.  Foley balloon placed at this check.  Additional 50 mcg Cytotec given #Pain: epidural #FWB: Cat 1 #GBS negative  Celedonio Savage, MD Center for [facility], [facility] 11:47 PM

## 2022-08-02 NOTE — Progress Notes (Signed)
Labor Progress Note Alexys Lobello is a 21 y.o. G2P1001 at [redacted]w[redacted]d presented for IOL 2/2 recommendation of MFM for oligohydramnios with AFI 2.3 cm.  S: Patient is resting comfortably. No concerns at this time. Feeling contractions but without pain.  O:  BP 113/76   Pulse 88   Temp 98.6 F (37 C) (Oral)   Resp 16   LMP 11/16/2021 (Exact Date)  EFM: 130s/moderate variability/+accels, no decels  CVE: Dilation: Fingertip Effacement (%): 20 Cervical Position: Posterior Station: -3 Presentation: Vertex Exam by:: lee  A&P: 21 y.o. G2P1001 106w0d presenting for IOL 2/2 oligohydramnios. #Labor: Progressing well. Cyto 50 x1 and 25 x2 given. Next check 10:30 PM. #Pain: epidural #FWB: Cat 1 #GBS negative  Janeal Holmes, MD Center for [facility], [facility] 7:32 PM

## 2022-08-02 NOTE — H&P (Addendum)
OBSTETRIC ADMISSION HISTORY AND PHYSICAL  Katie Ochoa is a 21 y.o. female G2P1001 with IUP at 75w0dby 12w [place] due to oligohydramnios. She reports +FMs, No LOF, no VB, no blurry vision, headaches. She plans on breast feeding. She requests nexplanon for birth control. She [place] [facility] [place]  [facility] [place] [place]  Estimated Date of Delivery: 08/23/22  Sono:   _0 , CWD, normal anatomy, cephalic presentation,  26283M 23% EFW  Prenatal History/Complications:  -oligohydramnios with AFI 2.3 and BPP 8/8 -depression/anxiety -pre-diabetes A1c 5.9  Past Medical History: Past Medical History:  Diagnosis Date   Anemia    COVID-19    02/08/19    Past Surgical History: Past Surgical History:  Procedure Laterality Date   NO PAST SURGERIES      Obstetrical History: OB History     Gravida  2   Para  1   Term  1   Preterm      AB      Living  1      SAB      IAB      Ectopic      Multiple  0   Live Births  1           Social History Social History   Socioeconomic History   Marital status: Single    Spouse name: Not on file   Number of children: Not on file   Years of education: Not on file   Highest education level: Not on file  Occupational History   Not on file  Tobacco Use   Smoking status: Never   Smokeless tobacco: Never  Vaping Use   Vaping Use: Never used  Substance and Sexual Activity   Alcohol use: Not Currently    Alcohol/week: 1.0 standard drink of alcohol    Types: 1 Cans of beer per week   Drug use: Never   Sexual activity: Yes    Partners: Male    Birth control/protection: None  Other Topics Concern   Not on file  Social History Narrative   Not on file   Social Determinants of Health   Financial Resource Strain: Not on file  Food Insecurity: Not on file  Transportation Needs: Not on file  Physical Activity: Not on file  Stress: Not on file  Social Connections: Not on file     Family History: Family History  Problem Relation Age of Onset   Hypertension Mother    Diabetes Mother    Hypertension Father    Hypertension Sister     Allergies: No Known Allergies  Pt denies allergies to latex, iodine, or shellfish.  Medications Prior to Admission  Medication Sig Dispense Refill Last Dose   Blood Pressure Monitoring (BLOOD PRESSURE KIT) DEVI 1 kit by Does not apply route once a week. 1 each 0    Prenatal Vit-Fe Fumarate-FA (MULTIVITAMIN-PRENATAL) 27-0.8 MG TABS tablet Take 1 tablet by mouth daily at 12 noon.      sertraline (ZOLOFT) 100 MG tablet Take 1 tablet (100 mg total) by mouth daily. (Patient not taking: Reported on 05/21/2022) 90 tablet 3      Review of Systems   All systems reviewed and negative except as stated in HPI  Blood pressure 115/76, pulse (!) 109, resp. rate 14, last menstrual period 11/16/2021, unknown if currently breastfeeding. General appearance: alert, cooperative, appears stated age, and no distress Lungs: breathing and speaking comfortably on RA Heart: intermittently  tachycardic, baseline of 80s Abdomen: gravid abdomen Extremities: moves all equally Presentation: cephalic Fetal monitoringBaseline: 140s bpm, Variability: Good {> 6 bpm), Accelerations: Reactive, and Decelerations: Absent Uterine activity: some irritability without true contractions, every 2-3 minutes Dilation: Fingertip Effacement (%): 20 Station: -3 Exam by:: lee  Prenatal labs: ABO, Rh: --/--/PENDING (11/17 1417) Antibody: PENDING (11/17 1417) Rubella: 4.71 (10/12 1504) RPR: Non Reactive (10/12 1504)  HBsAg: Negative (10/12 1504)  HIV: Non Reactive (10/12 1504)  GBS: Negative/-- (11/14 0900)  1 hr Glucola: 169 Genetic screening:  low-risk female Anatomy US: oligohydramnios  Prenatal Transfer Tool  Maternal Diabetes: no, pre-diabetes per A1c 5.9 Genetic Screening: Normal Maternal Ultrasounds/Referrals: Other: oligohydramnios Fetal Ultrasounds or  other Referrals:  Referred to Materal Fetal Medicine  Maternal Substance Abuse:  No Significant Maternal Medications:  zoloft 100 mg daily Significant Maternal Lab Results: Group B Strep negative, Rh negative, and HBsAG positive  Results for orders placed or performed during the hospital encounter of 08/02/22 (from the past 24 hour(s))  CBC   Collection Time: 08/02/22  2:17 PM  Result Value Ref Range   WBC 12.5 (H) 4.0 - 10.5 K/uL   RBC 4.11 3.87 - 5.11 MIL/uL   Hemoglobin 10.8 (L) 12.0 - 15.0 g/dL   HCT 31.9 (L) 36.0 - 46.0 %   MCV 77.6 (L) 80.0 - 100.0 fL   MCH 26.3 26.0 - 34.0 pg   MCHC 33.9 30.0 - 36.0 g/dL   RDW 13.4 11.5 - 15.5 %   Platelets 321 150 - 400 K/uL   nRBC 0.0 0.0 - 0.2 %  Type and screen   Collection Time: 08/02/22  2:17 PM  Result Value Ref Range   ABO/RH(D) PENDING    Antibody Screen PENDING    Sample Expiration      08/05/2022,2359 Performed at [facility], [place] [address], [place], [place] [address]     Patient Active Problem List   Diagnosis Date Noted   Encounter for elective induction of labor 08/02/2022   Oligohydramnios 08/01/2022   Urinary tract infection affecting pregnancy 07/05/2022   Supervision of other normal pregnancy, antepartum 05/21/2022   Depression 11/09/2020   Anxiety 11/09/2020   Assessment/Plan:  Richard Holz is a 21 y.o. G2P1001 at 60w0dhere for IOL 2/2 recommendation of MFM for oligohydramnios with AFI 2.3 cm.  #Labor: Cyto 50/25 x1 given. Assess for FB at next cervical exam. #Pain: epidural #FWB: Cat 1 #ID:  GBS neg #MOF: breast #MOC: nexplanon #Circ:  yes  BEthelene Hal MD  Center for [facility] 08/02/2022, 3:13 PM  GME ATTESTATION:  I saw and evaluated the patient. I agree with the findings and the plan of care as documented in the resident's note. I have made changes to documentation as necessary.  SGerlene Fee DO OB Fellow, [place] [facility] [place]11/17/2023, 3:20 PM

## 2022-08-03 ENCOUNTER — Encounter (HOSPITAL_COMMUNITY): Payer: Self-pay | Admitting: Obstetrics and Gynecology

## 2022-08-03 ENCOUNTER — Inpatient Hospital Stay (HOSPITAL_COMMUNITY): Payer: Medicaid Other | Admitting: Anesthesiology

## 2022-08-03 DIAGNOSIS — O4103X0 Oligohydramnios, third trimester, not applicable or unspecified: Secondary | ICD-10-CM

## 2022-08-03 DIAGNOSIS — Z3A37 37 weeks gestation of pregnancy: Secondary | ICD-10-CM

## 2022-08-03 LAB — RPR: RPR Ser Ql: NONREACTIVE

## 2022-08-03 MED ORDER — DIBUCAINE (PERIANAL) 1 % EX OINT: 1.0000 | TOPICAL_OINTMENT | CUTANEOUS | Status: DC | PRN

## 2022-08-03 MED ORDER — ONDANSETRON HCL 4 MG PO TABS: 4.0000 mg | ORAL_TABLET | ORAL | Status: DC | PRN

## 2022-08-03 MED ORDER — IBUPROFEN 600 MG PO TABS
600.0000 mg | ORAL_TABLET | Freq: Four times a day (QID) | ORAL | Status: DC
  Administered 2022-08-03 – 2022-08-04 (×5): 600 mg via ORAL
  Filled 2022-08-03 (×5): qty 1

## 2022-08-03 MED ORDER — MEASLES, MUMPS & RUBELLA VAC IJ SOLR: 0.5000 mL | Freq: Once | INTRAMUSCULAR | Status: DC

## 2022-08-03 MED ORDER — SODIUM CHLORIDE 0.9 % IV SOLN: 250.0000 mL | INTRAVENOUS | Status: DC | PRN

## 2022-08-03 MED ORDER — ACETAMINOPHEN 325 MG PO TABS: 650.0000 mg | ORAL_TABLET | ORAL | Status: DC | PRN

## 2022-08-03 MED ORDER — BENZOCAINE-MENTHOL 20-0.5 % EX AERO
1.0000 | INHALATION_SPRAY | CUTANEOUS | Status: DC | PRN
  Filled 2022-08-03: qty 56

## 2022-08-03 MED ORDER — TETANUS-DIPHTH-ACELL PERTUSSIS 5-2.5-18.5 LF-MCG/0.5 IM SUSY: 0.5000 mL | PREFILLED_SYRINGE | Freq: Once | INTRAMUSCULAR | Status: DC

## 2022-08-03 MED ORDER — LIDOCAINE HCL (PF) 1 % IJ SOLN
INTRAMUSCULAR | Status: DC | PRN
  Administered 2022-08-03: 10 mL via EPIDURAL

## 2022-08-03 MED ORDER — SENNOSIDES-DOCUSATE SODIUM 8.6-50 MG PO TABS
2.0000 | ORAL_TABLET | ORAL | Status: DC
  Administered 2022-08-03 – 2022-08-04 (×2): 2 via ORAL
  Filled 2022-08-03 (×3): qty 2

## 2022-08-03 MED ORDER — WITCH HAZEL-GLYCERIN EX PADS: 1.0000 | MEDICATED_PAD | CUTANEOUS | Status: DC | PRN

## 2022-08-03 MED ORDER — PRENATAL MULTIVITAMIN CH
1.0000 | ORAL_TABLET | Freq: Every day | ORAL | Status: DC
  Administered 2022-08-03 – 2022-08-04 (×2): 1 via ORAL
  Filled 2022-08-03 (×2): qty 1

## 2022-08-03 MED ORDER — SODIUM CHLORIDE 0.9% FLUSH: 3.0000 mL | INTRAVENOUS | Status: DC | PRN

## 2022-08-03 MED ORDER — COCONUT OIL OIL: 1.0000 | TOPICAL_OIL | Status: DC | PRN

## 2022-08-03 MED ORDER — DIPHENHYDRAMINE HCL 25 MG PO CAPS: 25.0000 mg | ORAL_CAPSULE | Freq: Four times a day (QID) | ORAL | Status: DC | PRN

## 2022-08-03 MED ORDER — SIMETHICONE 80 MG PO CHEW: 80.0000 mg | CHEWABLE_TABLET | ORAL | Status: DC | PRN

## 2022-08-03 MED ORDER — SODIUM CHLORIDE 0.9% FLUSH: 3.0000 mL | Freq: Two times a day (BID) | INTRAVENOUS | Status: DC

## 2022-08-03 MED ORDER — ONDANSETRON HCL 4 MG/2ML IJ SOLN: 4.0000 mg | INTRAMUSCULAR | Status: DC | PRN

## 2022-08-03 NOTE — Lactation Note (Signed)
This note was copied from a baby's chart. Lactation Consultation Note  Patient Name: Katie Ochoa [id number]'P Date: 08/03/2022 Reason for consult: Follow-up assessment Age:22 hours  P2, Mother denies questions or concerns.  Feed on demand with cues.  Goal 8-12+ times per day after first 24 hrs.  Place baby STS if not cueing.  Mom made aware of O/P services, breastfeeding support groups, community resources, and our phone # for post-discharge questions.    Maternal Data Does the patient have breastfeeding experience prior to this delivery?: Yes How long did the patient breastfeed?: 2 years  Feeding Mother's Current Feeding Choice: Breast Milk  LATCH Score Latch: Grasps breast easily, tongue down, lips flanged, rhythmical sucking.  Audible Swallowing: A few with stimulation  Type of Nipple: Everted at rest and after stimulation  Comfort (Breast/Nipple): Soft / non-tender  Hold (Positioning): No assistance needed to correctly position infant at breast.  LATCH Score: 9   Lactation Tools Discussed/Used Tools: Pump Breast pump type: Manual Reason for Pumping: requesdt  Interventions Interventions: Hand pump;Education;LC Services brochure  Discharge    Consult Status Consult Status: Follow-up Date: 08/04/22 Follow-up type: In-patient    Dahlia Byes [facility] 08/03/2022, 10:32 AM

## 2022-08-03 NOTE — Anesthesia Procedure Notes (Signed)
Epidural Patient location during procedure: OB Start time: 08/03/2022 4:29 AM End time: 08/03/2022 4:33 AM  Staffing Anesthesiologist: Leilani Able, MD Performed: anesthesiologist   Preanesthetic Checklist Completed: patient identified, IV checked, site marked, risks and benefits discussed, surgical consent, monitors and equipment checked, pre-op evaluation and timeout performed  Epidural Patient position: sitting Prep: DuraPrep and site prepped and draped Patient monitoring: continuous pulse ox and blood pressure Approach: midline Location: L3-L4 Injection technique: LOR air  Needle:  Needle type: Tuohy  Needle gauge: 17 G Needle length: 9 cm and 9 Needle insertion depth: 7 cm Catheter type: closed end flexible Catheter size: 19 Gauge Catheter at skin depth: 12 cm Test dose: negative and Other  Assessment Events: blood not aspirated, injection not painful, no injection resistance, no paresthesia and negative IV test  Additional Notes Reason for block:procedure for pain

## 2022-08-03 NOTE — Anesthesia Postprocedure Evaluation (Signed)
Anesthesia Post Note  Patient: Katie Ochoa  Procedure(s) Performed: AN AD HOC LABOR EPIDURAL     Patient location during evaluation: Mother Baby Anesthesia Type: Epidural Level of consciousness: awake Pain management: satisfactory to patient Vital Signs Assessment: post-procedure vital signs reviewed and stable Respiratory status: spontaneous breathing Cardiovascular status: stable Anesthetic complications: no   No notable events documented.  Last Vitals:  Vitals:   08/03/22 1042 08/03/22 1524  BP: 111/68 122/75  Pulse: 85 86  Resp: 18 18  Temp: 36.7 C 36.7 C  SpO2: 99%     Last Pain:  Vitals:   08/03/22 0900  TempSrc:   PainSc: 0-No pain   Pain Goal:                   [employer]

## 2022-08-03 NOTE — Anesthesia Preprocedure Evaluation (Signed)
Anesthesia Evaluation  Patient identified by MRN, date of birth, ID band Patient awake    Reviewed: Allergy & Precautions, NPO status , Patient's Chart, lab work & pertinent test results  Airway Mallampati: I       Dental no notable dental hx.    Pulmonary neg pulmonary ROS   Pulmonary exam normal        Cardiovascular negative cardio ROS Normal cardiovascular exam Rhythm:Regular     Neuro/Psych  Headaches PSYCHIATRIC DISORDERS Anxiety Depression       GI/Hepatic negative GI ROS, Neg liver ROS,,,  Endo/Other  Obesity   Renal/GU negative Renal ROS     Musculoskeletal negative musculoskeletal ROS (+)    Abdominal  (+) + obese  Peds  Hematology  (+) Blood dyscrasia, anemia Plt 291k   Anesthesia Other Findings Day of surgery medications reviewed with the patient.  Reproductive/Obstetrics (+) Pregnancy                             Anesthesia Physical Anesthesia Plan  ASA: II  Anesthesia Plan: Epidural   Post-op Pain Management:    Induction:   PONV Risk Score and Plan:   Airway Management Planned:   Additional Equipment:   Intra-op Plan:   Post-operative Plan:   Informed Consent: I have reviewed the patients History and Physical, chart, labs and discussed the procedure including the risks, benefits and alternatives for the proposed anesthesia with the patient or authorized representative who has indicated his/her understanding and acceptance.       Plan Discussed with:   Anesthesia Plan Comments:         Anesthesia Quick Evaluation

## 2022-08-03 NOTE — Progress Notes (Signed)
Labor Progress Note Lolly Glaus is a 21 y.o. G2P1001 at [redacted]w[redacted]d presented for IOL 2/2 recommendation of MFM for oligohydramnios with AFI 2.3cm.   S: pt resting comfortably  O:  BP 124/73   Pulse 74   Temp 98.1 F (36.7 C) (Oral)   Resp 18   LMP 11/16/2021 (Exact Date)  EFM: 115/moderate variability/+accels/no decels CTX q5-10  CVE: Dilation: 1 Effacement (%): 70 Cervical Position: Posterior Station: -2 Presentation: Vertex Exam by:: Dr. Nobie Putnam   A&P: 21 y.o. G2P1001 [redacted]w[redacted]d for IOL 2/2 recommendation of MFM for oligohydramnios with AFI 2.3cm.  #Labor: Progressing well. FB still in place. Plan for pit once foley out.  #Pain: epidural  #FWB: Cat 1  #GBS negative   Theo Dills, MD PGY3 Family Medicine Resident  [facility] 3:48 AM

## 2022-08-03 NOTE — Lactation Note (Signed)
This note was copied from a baby's chart. Lactation Consultation Note  Patient Name: Katie Ochoa [id number] Date: 08/03/2022 Reason for consult: L&D Initial assessment Age:22 hours  P2, Baby was latched upon entering.  Provided pillow for support.  Lactation to follow up on MBU.   Maternal Data Does the patient have breastfeeding experience prior to this delivery?: Yes How long did the patient breastfeed?: 2 years  Feeding Mother's Current Feeding Choice: Breast Milk  LATCH Score Latch: Grasps breast easily, tongue down, lips flanged, rhythmical sucking.  Audible Swallowing: A few with stimulation  Type of Nipple: Everted at rest and after stimulation  Comfort (Breast/Nipple): Soft / non-tender  Hold (Positioning): No assistance needed to correctly position infant at breast.  LATCH Score: 9   Interventions Interventions: Education  Consult Status Consult Status: Follow-up from L&D    Katie Ochoa [facility] 08/03/2022, 8:19 AM

## 2022-08-04 MED ORDER — ACETAMINOPHEN 325 MG PO TABS: 650.0000 mg | ORAL_TABLET | ORAL | 0 refills | Status: AC | PRN

## 2022-08-04 MED ORDER — IBUPROFEN 600 MG PO TABS: 600.0000 mg | ORAL_TABLET | Freq: Four times a day (QID) | ORAL | 0 refills | Status: DC

## 2022-08-04 NOTE — Discharge Summary (Signed)
Postpartum Discharge Summary    Patient Name: Katie Ochoa DOB: 2000/09/23 MRN: [id number]  Date of admission: 08/02/2022 Delivery date:08/03/2022  Delivering provider: Concepcion Living  Date of discharge: 08/04/2022  Admitting diagnosis: Encounter for elective induction of labor [Z34.90] Intrauterine pregnancy: [redacted]w[redacted]d    Secondary diagnosis:  Principal Problem:   Encounter for elective induction of labor Active Problems:   Oligohydramnios  Additional problems: n/a    Discharge diagnosis: Term Pregnancy Delivered and Anemia                                              Post partum procedures: N/A Augmentation: Pitocin, Cytotec, and IP Foley Complications: None  Hospital course: Induction of Labor With Vaginal Delivery   21y.o. yo [id number] admitted to the hospital 08/02/2022 for induction of labor.  Indication for induction:  oligohydramnios AFI of 2.3 .  Patient had an uncomplicated labor course. Membrane Rupture Time/Date: 5:12 AM ,08/03/2022   Delivery Method:Vaginal, Spontaneous  Episiotomy: None  Lacerations:  1st degree;Periurethral  Details of delivery can be found in separate delivery note.  Patient had an uncomplicated postpartum course. Patient is discharged home 08/04/22.  Newborn Data: Birth date:08/03/2022  Birth time:7:39 AM  Gender:Female  Living status:Living  Apgars:9 ,9  Weight:2510 g   Magnesium Sulfate received: No BMZ received: No Rhophylac:No MMR:Yes T-DaP: Declined 10/12 Flu: No, declined 10/12 Transfusion:No  Physical exam  Vitals:   08/03/22 1524 08/03/22 1949 08/03/22 2323 08/04/22 0512  BP: 122/75 99/66 102/61 100/73  Pulse: 86 74 73 76  Resp: _0 Temp: 98 F (36.7 C) 98.3 F (36.8 C) 99 F (37.2 C) 98 F (36.7 C)  TempSrc:  Oral Oral Oral  SpO2:  98% 96% 97%   General: alert, cooperative, and no distress Lochia: appropriate Uterine Fundus: firm Incision: N/A DVT Evaluation: No evidence of DVT seen on  physical exam. Labs: Lab Results  Component Value Date   WBC 12.5 (H) 08/02/2022   HGB 10.8 (L) 08/02/2022   HCT 31.9 (L) 08/02/2022   MCV 77.6 (L) 08/02/2022   PLT 321 08/02/2022      Latest Ref Rng & Units 11/09/2020   11:36 AM  CMP  Glucose 65 - 99 mg/dL 83   BUN 6 - 20 mg/dL 9   Creatinine 0.57 - 1.00 mg/dL 0.52   Sodium 134 - 144 mmol/L 138   Potassium 3.5 - 5.2 mmol/L 4.5   Chloride 96 - 106 mmol/L 105   CO2 20 - 29 mmol/L 19   Calcium 8.7 - 10.2 mg/dL 9.0   Total Protein 6.0 - 8.5 g/dL 6.4   Total Bilirubin 0.0 - 1.2 mg/dL 0.3   Alkaline Phos 42 - 106 IU/L 84   AST 0 - 40 IU/L 16   ALT 0 - 32 IU/L 14    Edinburgh Score:    08/03/2022    9:37 AM  Edinburgh Postnatal Depression Scale Screening Tool  I have been able to laugh and see the funny side of things. 0  I have looked forward with enjoyment to things. 0  I have blamed myself unnecessarily when things went wrong. 0  I have been anxious or worried for no good reason. 0  I have felt scared or panicky for no good reason. 0  Things have been getting  on top of me. 1  I have been so unhappy that I have had difficulty sleeping. 0  I have felt sad or miserable. 0  I have been so unhappy that I have been crying. 0  The thought of harming myself has occurred to me. 0  Edinburgh Postnatal Depression Scale Total 1     After visit meds:  Allergies as of 08/04/2022   No Known Allergies      Medication List     STOP taking these medications    Blood Pressure Kit Devi       TAKE these medications    acetaminophen 325 MG tablet Commonly known as: Tylenol Take 2 tablets (650 mg total) by mouth every 4 (four) hours as needed (for pain scale < 4).   ibuprofen 600 MG tablet Commonly known as: ADVIL Take 1 tablet (600 mg total) by mouth every 6 (six) hours.   multivitamin-prenatal 27-0.8 MG Tabs tablet Take 1 tablet by mouth daily at 12 noon.   sertraline 100 MG tablet Commonly known as: Zoloft Take 1  tablet (100 mg total) by mouth daily.         Discharge home in stable condition Infant Feeding: Breast Infant Disposition:home with mother Discharge instruction: per After Visit Summary and Postpartum booklet. Activity: Advance as tolerated. Pelvic rest for 6 weeks.  Diet: routine diet Future Appointments: Future Appointments  Date Time Provider [place]  08/06/2022  8:35 AM Griffin Basil, MD [facility] None  08/13/2022  8:35 AM Gavin Pound, CNM [facility] None  08/20/2022  8:35 AM Leftwich-Kirby, Kathie Dike, CNM [facility] None   Follow up Visit:  Message sent to [facility] by Autry-Lott on 08/04/2022  Please schedule this patient for a In person postpartum visit in 6 weeks with the following provider: Any provider. Additional Postpartum F/U:Postpartum Depression checkup  High risk pregnancy complicated by:  oligo Delivery mode:  Vaginal, Spontaneous  Anticipated Birth Control:  Unsure   08/04/2022 Sairah Knobloch Autry-Lott, DO

## 2022-08-04 NOTE — Progress Notes (Signed)
CSW received consult for hx of Anxiety and Depression. CSW met with MOB to offer support and complete assessment. When CSW entered room, MOB was observed sitting in hospital bed, FOB was present sleeping on couch. Infant "[place]" was receiving circumcision. CSW introduced self and requested to speak with MOB alone. MOB provided verbal consent to complete consult with FOB present. CSW explained reason for consult. MOB presented as calm, was agreeable to consult and remained engaged throughout encounter.   CSW inquired how MOB has felt emotionally since giving birth. MOB reports she is feeling "good." CSW inquired about MOB's mental health during pregnancy. MOB reports she experienced several stressors during her pregnancy, marked by moving from [place] to [place] and separating from her ex. MOB also shared that her 22 year old son has cancer (was diagnosed 06/2021). CSW provided active listening and emotional support. MOB reports that considering her stressors, she has felt stable during her pregnancy with the exception of experiencing a lapse in depression symptoms 2/23-3/23 marked by feeling sad, crying often, and "sleeping too much". MOB reports she visited her family in [place] for a few months which helped improve her symptoms.  CSW inquired about MOB's mental health history. MOB reports she was first diagnosed with depression and anxiety when her 61-year-old son was 75 months old. MOB described her symptoms as postpartum depression, marked by difficulty bonding with infant and feeling stressed. MOB reports she felt her depression symptoms continued past her son's first birthday when he was diagnosed with cancer. MOB reports she was prescribed Zoloft in 2021 but stopped taking the medication prior to her current pregnancy. MOB reports she met with a counselor once last year but felt she did not connect with the therapist. MOB declined outpatient mental health resources at this time, sharing she feels  comfortable contacting her OBGYN if concerns arise. MOB identified her sister and brother-in-law as supports. MOB denied current SI/HI.   MOB reports she has a car seat and bassinet but is in need of clothes and more diapers and wipes for infant. CSW provided information about [employer] and provided MOB with a new baby bundle. MOB shared she has utilized [employer] for diapers in the past and plans to do so again. CSW provided information about [facility]), explaining a nurse can perform a postpartum check and bring diapers/wipes to the home visit, MOB expressed interest and provided verbal consent for CSW to place referral. CSW placed referral to [facility]. MOB has chosen [facility] for infant's follow up care.   CSW provided education regarding the baby blues period vs. perinatal mood disorders, discussed treatment and gave resources for mental health follow up if concerns arise.  CSW recommends self-evaluation during the postpartum time period using the New Mom Checklist from Postpartum Progress and encouraged MOB to contact a medical professional if symptoms are noted at any time.    CSW provided review of Sudden Infant Death Syndrome (SIDS) precautions.    CSW identifies no further need for intervention and no barriers to discharge at this time.  Signed,  Berniece Salines, MSW, LCSWA, LCASA 08/04/2022 2:21 PM

## 2022-08-05 ENCOUNTER — Ambulatory Visit: Payer: Medicaid Other

## 2022-08-06 ENCOUNTER — Encounter: Payer: Medicaid Other | Admitting: Obstetrics and Gynecology

## 2022-08-12 ENCOUNTER — Ambulatory Visit: Payer: Medicaid Other

## 2022-08-12 ENCOUNTER — Telehealth (HOSPITAL_COMMUNITY): Payer: Self-pay | Admitting: *Deleted

## 2022-08-12 ENCOUNTER — Other Ambulatory Visit: Payer: Medicaid Other

## 2022-08-12 NOTE — Telephone Encounter (Signed)
Hospital Discharge Follow-Up Call:  Patient reports that she is well and has no concerns about her healing process.  EPDS today was 1 and she endorses this accurately reflects that she is doing well emotionally.  Patient says that baby is well and she has no concerns about baby's health.  She reports that baby sleeps in a bassinet in parent's room.  Reviewed ABCs of Safe Sleep.

## 2022-08-16 ENCOUNTER — Inpatient Hospital Stay (HOSPITAL_COMMUNITY): Payer: Medicaid Other

## 2022-08-19 ENCOUNTER — Encounter: Payer: Medicaid Other | Admitting: Licensed Clinical Social Worker

## 2022-08-20 ENCOUNTER — Encounter: Payer: Medicaid Other | Admitting: Advanced Practice Midwife

## 2022-09-18 ENCOUNTER — Encounter: Payer: Self-pay | Admitting: Obstetrics

## 2022-09-18 ENCOUNTER — Institutional Professional Consult (permissible substitution): Payer: Medicaid Other | Admitting: Licensed Clinical Social Worker

## 2022-09-18 ENCOUNTER — Ambulatory Visit (INDEPENDENT_AMBULATORY_CARE_PROVIDER_SITE_OTHER): Payer: Medicaid Other | Admitting: Obstetrics

## 2022-09-18 DIAGNOSIS — Z30011 Encounter for initial prescription of contraceptive pills: Secondary | ICD-10-CM

## 2022-09-18 DIAGNOSIS — F32A Depression, unspecified: Secondary | ICD-10-CM | POA: Diagnosis not present

## 2022-09-18 DIAGNOSIS — Z3009 Encounter for other general counseling and advice on contraception: Secondary | ICD-10-CM

## 2022-09-18 LAB — POCT URINE PREGNANCY: Preg Test, Ur: NEGATIVE

## 2022-09-18 MED ORDER — NORETHINDRONE 0.35 MG PO TABS: 1.0000 | ORAL_TABLET | Freq: Every day | ORAL | 11 refills | Status: DC

## 2022-09-18 NOTE — Progress Notes (Signed)
Patient desires nexplanon for contraception. Last unprotected intercourse 4 days ago.

## 2022-09-18 NOTE — Progress Notes (Signed)
[facility] Partum Visit Note  Katie Ochoa is a 22 y.o. G92P2002 female who presents for a postpartum visit. She is 6 weeks postpartum following a normal spontaneous vaginal delivery.  I have fully reviewed the prenatal and intrapartum course. The delivery was at 53 gestational weeks.  Anesthesia: epidural. Postpartum course has been uncomplicated. Baby is doing well. Baby is feeding by both breast and bottle - Similac Neosure. Bleeding no bleeding. Bowel function is normal. Bladder function is normal. Patient is sexually active. Contraception method is none at this time, but desires nexplanon. Postpartum depression screening: negative.   The pregnancy intention screening data noted above was reviewed. Potential methods of contraception were discussed. The patient elected to proceed with No data recorded.   Edinburgh Postnatal Depression Scale - 09/18/22 0932       Edinburgh Postnatal Depression Scale:  In the Past 7 Days   I have been able to laugh and see the funny side of things. 1    I have looked forward with enjoyment to things. 1    I have blamed myself unnecessarily when things went wrong. 2    I have been anxious or worried for no good reason. 0    I have felt scared or panicky for no good reason. 0    Things have been getting on top of me. 1    I have been so unhappy that I have had difficulty sleeping. 0    I have felt sad or miserable. 2    I have been so unhappy that I have been crying. 1    The thought of harming myself has occurred to me. 0    Edinburgh Postnatal Depression Scale Total 8             Health Maintenance Due  Topic Date Due   COVID-19 Vaccine (1) Never done   HPV VACCINES (1 - 2-dose series) Never done   DTaP/Tdap/Td (1 - Tdap) Never done   INFLUENZA VACCINE  Never done    The following portions of the patient's history were reviewed and updated as appropriate: allergies, current medications, past family history, past medical history, past social  history, past surgical history, and problem list.  Review of Systems A comprehensive review of systems was negative.  Objective:  BP 112/77   Pulse 78   Ht 5\' 1"  (1.549 m)   Wt 162 lb 9.6 oz (73.8 kg)   LMP 11/16/2021 (Exact Date)   BMI 30.72 kg/m    General:  alert and no distress   Breasts:  normal  Lungs: clear to auscultation bilaterally  Heart:  regular rate and rhythm, S1, S2 normal, no murmur, click, rub or gallop  Abdomen: soft, non-tender; bowel sounds normal; no masses,  no organomegaly   Wound none  GU exam:  not indicated       Assessment:    1. Postpartum care following vaginal delivery  2. Depression, unspecified depression type Rx: - Ambulatory referral to [facility]  3. Encounter for other general counseling and advice on contraception - wants OC  4. Encounter for initial prescription of contraceptive pills Rx: - POCT urine pregnancy - norethindrone (MICRONOR) 0.35 MG tablet; Take 1 tablet (0.35 mg total) by mouth daily.  Dispense: 28 tablet; Refill: 11    Plan:   Essential components of care per ACOG recommendations:  1.  Mood and well being: Patient with negative depression screening today. Reviewed local resources for support.  - Patient tobacco use?  No.   - hx of drug use? No.    2. Infant care and feeding:  -Patient currently breastmilk feeding? Yes. Discussed returning to work and pumping. Reviewed importance of draining breast regularly to support lactation.  -Social determinants of health (SDOH) reviewed in EPIC. No concerns  3. Sexuality, contraception and birth spacing - Patient does not want a pregnancy in the next year.  Desired family size is 2 children.  - Reviewed reproductive life planning. Reviewed contraceptive methods based on pt preferences and effectiveness.  Patient desired Oral Contraceptive today.   - Discussed birth spacing of 18 months  4. Sleep and fatigue -Encouraged family/partner/community support  of 4 hrs of uninterrupted sleep to help with mood and fatigue  5. Physical Recovery  - Discussed patients delivery and complications. She describes her labor as good. - Patient had a Vaginal, no problems at delivery. Patient had a 1st degree laceration. Perineal healing reviewed. Patient expressed understanding - Patient has urinary incontinence? No. - Patient is safe to resume physical and sexual activity  6.  Health Maintenance - HM due items addressed Yes - Last pap smear  Diagnosis  Date Value Ref Range Status  06/27/2022 - Low grade squamous intraepithelial lesion (LSIL) (A)  Final   Pap smear not done at today's visit.  -Breast Cancer screening indicated? No.   7. Chronic Disease/Pregnancy Condition follow up: None   Baltazar Najjar, [place] for [facility], [place], [place] 09/18/2022

## 2023-09-23 ENCOUNTER — Ambulatory Visit: Payer: Medicaid Other | Admitting: Family Medicine

## 2023-09-23 ENCOUNTER — Encounter: Payer: Self-pay | Admitting: Family Medicine

## 2023-09-23 VITALS — BP 111/73 | HR 75 | Ht 61.0 in | Wt 170.0 lb

## 2023-09-23 DIAGNOSIS — Z308 Encounter for other contraceptive management: Secondary | ICD-10-CM | POA: Diagnosis not present

## 2023-09-23 MED ORDER — SLYND 4 MG PO TABS: 1.0000 | ORAL_TABLET | Freq: Every day | ORAL | 11 refills | Status: AC

## 2023-09-23 NOTE — Progress Notes (Signed)
 23 y.o. GYN presents for [facility] management, Pt stopped breastfeeding and would like the OCP switched to another Pill.

## 2023-09-24 NOTE — Progress Notes (Signed)
    SUBJECTIVE:   CHIEF COMPLAINT / HPI:   Contraceptive management appointment Patient presenting for evaluation for contraceptive management.  She is currently on Micronor  which she was prescribed postpartum because she was breast-feeding.  She would like something that she has more winded to take.  She is interested in pills and is not interested in anything long-term at this point.  She reports that her mother is currently hospitalized for blood clots in her legs which they were worried were going to go to her lungs.  She also reports her grandmother has history of ovarian cancer.  PERTINENT  PMH / PSH: Recent delivery  OBJECTIVE:   BP 111/73   Pulse 75   Ht 5' 1 (1.549 m)   Wt 170 lb (77.1 kg)   LMP 09/07/2023 (Exact Date)   BMI 32.12 kg/m   General: Well-appearing 23 year old female in no acute distress Cardiac: Regular rate Respiratory: Normal work of breathing, speaking in full sentences MSK: No gross abnormalities  ASSESSMENT/PLAN:   Encounter for other contraceptive management Patient currently on Micronor .  Would like to switch to something that has a wider window to take.  Discussed that given her maternal history of blood clots estrogen containing birth controls are not contraindicated but that she may be at increased risk for blood clots.  Patient would like to stay away from this.  Will prescribe Slynd .  Discussed longer acting things such as IUDs or Nexplanon 's.  Patient will consider but will continue on pills at this time.     Katie Ochoa Katie Havlin, MD [facility]

## 2023-09-25 DIAGNOSIS — Z308 Encounter for other contraceptive management: Secondary | ICD-10-CM | POA: Insufficient documentation

## 2023-09-25 NOTE — Assessment & Plan Note (Signed)
 Patient currently on Micronor .  Would like to switch to something that has a wider window to take.  Discussed that given her maternal history of blood clots estrogen containing birth controls are not contraindicated but that she may be at increased risk for blood clots.  Patient would like to stay away from this.  Will prescribe Slynd .  Discussed longer acting things such as IUDs or Nexplanon 's.  Patient will consider but will continue on pills at this time.

## 2023-10-24 ENCOUNTER — Other Ambulatory Visit: Payer: Self-pay

## 2023-10-24 ENCOUNTER — Emergency Department (HOSPITAL_BASED_OUTPATIENT_CLINIC_OR_DEPARTMENT_OTHER)
Admission: EM | Admit: 2023-10-24 | Discharge: 2023-10-24 | Discharge status: Against Medical Advice | Payer: Medicaid Other | Attending: Emergency Medicine | Admitting: Emergency Medicine

## 2023-10-24 DIAGNOSIS — R6884 Jaw pain: Secondary | ICD-10-CM | POA: Insufficient documentation

## 2023-10-24 DIAGNOSIS — R220 Localized swelling, mass and lump, head: Secondary | ICD-10-CM | POA: Insufficient documentation

## 2023-10-24 DIAGNOSIS — Z5321 Procedure and treatment not carried out due to patient leaving prior to being seen by health care provider: Secondary | ICD-10-CM | POA: Diagnosis not present

## 2023-10-24 DIAGNOSIS — K0889 Other specified disorders of teeth and supporting structures: Secondary | ICD-10-CM | POA: Insufficient documentation

## 2023-10-24 DIAGNOSIS — H9201 Otalgia, right ear: Secondary | ICD-10-CM | POA: Insufficient documentation

## 2023-10-24 NOTE — ED Triage Notes (Signed)
 Pt POV reporting R side wisdom tooth pain a week ago, pain now moved up to ear, R jaw swollen.

## 2023-10-24 NOTE — ED Notes (Signed)
Pt called for room assignment. No answer. 

## 2024-02-18 ENCOUNTER — Other Ambulatory Visit: Payer: Self-pay

## 2024-02-18 ENCOUNTER — Encounter (HOSPITAL_COMMUNITY): Payer: Self-pay | Admitting: Emergency Medicine

## 2024-02-18 ENCOUNTER — Emergency Department (HOSPITAL_COMMUNITY)
Admission: EM | Admit: 2024-02-18 | Discharge: 2024-02-19 | Disposition: A | Discharge status: Home / Self Care | Attending: Emergency Medicine | Admitting: Emergency Medicine

## 2024-02-18 DIAGNOSIS — F419 Anxiety disorder, unspecified: Secondary | ICD-10-CM | POA: Insufficient documentation

## 2024-02-18 DIAGNOSIS — G514 Facial myokymia: Secondary | ICD-10-CM | POA: Insufficient documentation

## 2024-02-18 LAB — COMPREHENSIVE METABOLIC PANEL WITH GFR
ALT: 22 U/L (ref 0–44)
AST: 17 U/L (ref 15–41)
Albumin: 3.6 g/dL (ref 3.5–5.0)
Alkaline Phosphatase: 62 U/L (ref 38–126)
Anion gap: 8 (ref 5–15)
BUN: 11 mg/dL (ref 6–20)
CO2: 26 mmol/L (ref 22–32)
Calcium: 8.9 mg/dL (ref 8.9–10.3)
Chloride: 105 mmol/L (ref 98–111)
Creatinine, Ser: 0.62 mg/dL (ref 0.44–1.00)
GFR, Estimated: >60 mL/min (ref >60)
Glucose, Bld: 115 mg/dL — ABNORMAL HIGH (ref 70–99)
Potassium: 3.5 mmol/L (ref 3.5–5.1)
Sodium: 139 mmol/L (ref 135–145)
Total Bilirubin: 0.4 mg/dL (ref 0.0–1.2)
Total Protein: 6.3 g/dL — ABNORMAL LOW (ref 6.5–8.1)

## 2024-02-18 LAB — CBC
HCT: 38.6 % (ref 36.0–46.0)
Hemoglobin: 13 g/dL (ref 12.0–15.0)
MCH: 29.3 pg (ref 26.0–34.0)
MCHC: 33.7 g/dL (ref 30.0–36.0)
MCV: 87.1 fL (ref 80.0–100.0)
Platelets: 383 10*3/uL (ref 150–400)
RBC: 4.43 MIL/uL (ref 3.87–5.11)
RDW: 12.8 % (ref 11.5–15.5)
WBC: 13.5 10*3/uL — ABNORMAL HIGH (ref 4.0–10.5)
nRBC: 0 % (ref 0.0–0.2)

## 2024-02-18 LAB — HCG, SERUM, QUALITATIVE: Preg, Serum: NEGATIVE

## 2024-02-18 NOTE — ED Triage Notes (Addendum)
 Pt complains of left sided facial twitching started yesterday. Denies any pain. Decreased sensation to left side of face as well. Intermittent dizziness since yesterday none at this time.

## 2024-02-19 ENCOUNTER — Ambulatory Visit: Admitting: Student

## 2024-02-19 ENCOUNTER — Encounter: Payer: Self-pay | Admitting: Student

## 2024-02-19 VITALS — BP 121/83 | HR 86 | Temp 99.0°F | Ht 61.0 in | Wt 168.6 lb

## 2024-02-19 DIAGNOSIS — F419 Anxiety disorder, unspecified: Secondary | ICD-10-CM | POA: Diagnosis not present

## 2024-02-19 DIAGNOSIS — F331 Major depressive disorder, recurrent, moderate: Secondary | ICD-10-CM | POA: Diagnosis present

## 2024-02-19 DIAGNOSIS — Z0000 Encounter for general adult medical examination without abnormal findings: Secondary | ICD-10-CM | POA: Insufficient documentation

## 2024-02-19 MED ORDER — ESCITALOPRAM OXALATE 5 MG PO TABS: 5.0000 mg | ORAL_TABLET | Freq: Every day | ORAL | 2 refills | Status: DC

## 2024-02-19 NOTE — Patient Instructions (Signed)
 Thank you, Ms.Daquana Jurgens for allowing us  to provide your care today. Today we discussed   -Referral for therapy/psychology  -Start Lexapro (escitalopram) 5 mg once a day  -Follow up visit in 4-6 weeks    See below for additional resources:  Here are some local behavioral health centers listed below: -[facility] Out[facility]: [phone]) [phone] -[facility]: [phone]  If you feel like you may hurt yourself or others, or have thoughts about taking your own life, please get help right away. Here are some resources available to you: -Call your local emergency services (911) -Call the [facility] and [employer] Helpline [phone]) -Go to the nearest emergency room. -Call a suicide hotline to talk to a trained counselor.  1-800-273-TALK 305-020-9732) 1-800-SUICIDE 574-453-2121 (For Spanish speakers) 531-581-2677 (For TTY users) 1-866-4-U-TREVOR 843-242-5199) (For LGBTQ community)  70 (This is a crisis hotline you may call or text)   I have ordered the following medication/changed the following medications:  Start the following medications: Meds ordered this encounter  Medications   escitalopram (LEXAPRO) 5 MG tablet    Sig: Take 1 tablet (5 mg total) by mouth daily.    Dispense:  30 tablet    Refill:  2     Follow up: 4-6 weeks   Should you have any questions or concerns please call the internal medicine clinic at [phone].    Lola Czerwonka, D.O. [facility]

## 2024-02-19 NOTE — Assessment & Plan Note (Signed)
 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

## 2024-02-19 NOTE — ED Notes (Signed)
 Pt brought back from lobby. Ambulatory to assigned bed. Pt reports onset of intermittent dizziness that started today at 1 pm. Sts dizziness associated with change in positions. Denies NVD. Also reports concern for left sided facial numbness. No arm/leg numbness/weakness. No headache. No other neurological s/s. Pt reports resolution of dizziness and facial numbness at this time. Vs updated.

## 2024-02-19 NOTE — ED Notes (Signed)
 AVS provided by edp was reviewed with pt. Pt verbalized understanding with no additional questions at this time.

## 2024-02-19 NOTE — Assessment & Plan Note (Addendum)
 ED visit on 6/4. Presented with L face twitching and numbness. Associated with chest tightness with breaths. States social stressors before symptom presentation. Symptoms resolved after some time. Endorses crying more and feeling stressed. Not first episode and has had symptoms in past. Hx of depression was on Zoloft  but stopped years ago. States the high dose made her feel flat. Denies SI/HI. No symptoms today. Protective factors: friends that she calls. States she is ready to restart anti-depressant and we discussed therapy which could help. PHQ-9 was 10 and GAD-7 was 11.   Plan -Referral to [facility] ([facility]) -Start Lexapro 5 mg daily  -Resources listed on AVS including [facility] -F/u in 4-6 weeks and consider titration of Lexapro

## 2024-02-19 NOTE — ED Provider Notes (Signed)
 [facility] Emergency Department Provider Note MRN:  [id number]  Arrival date & time: 02/19/24     Chief Complaint   Numbness   History of Present Illness   Katie Ochoa is a 23 y.o. year-old female presents to the ED with chief complaint of days of intermittent left thigh/face twitching.  She states that when this occurs it causes her to feel very anxious.  She reports some associated tingling sensation.  She denies any numbness, weakness, vision changes, taste changes, rash.  Denies any treatments prior to arrival.  States that she does feel anxious from time to time, but has not been diagnosed with anxiety.  History provided by patient.   Review of Systems  Pertinent positive and negative review of systems noted in HPI.    Physical Exam   Vitals:   02/18/24 2149 02/19/24 0138  BP: (!) 137/91 114/82  Pulse: (!) 113 88  Resp: 18 15  Temp: 98.4 F (36.9 C) 98.2 F (36.8 C)  SpO2: 100% 99%    CONSTITUTIONAL:  non toxic-appearing, NAD NEURO:  Alert and oriented x 3, CN 3-12 grossly intact, no facial droop or sign of bell's palsy EYES:  eyes equal and reactive ENT/NECK:  Supple, no stridor  CARDIO:  normal rate on my exam, regular rhythm, appears well-perfused  PULM:  No respiratory distress, CTAB GI/GU:  non-distended,  MSK/SPINE:  No gross deformities, no edema, moves all extremities  SKIN:  no rash, atraumatic   *Additional and/or pertinent findings included in MDM below  Diagnostic and Interventional Summary    EKG Interpretation Date/Time:    Ventricular Rate:    PR Interval:    QRS Duration:    QT Interval:    QTC Calculation:   R Axis:      Text Interpretation:         Labs Reviewed  COMPREHENSIVE METABOLIC PANEL WITH GFR - Abnormal; Notable for the following components:      Result Value   Glucose, Bld 115 (*)    Total Protein 6.3 (*)    All other components within normal limits  CBC - Abnormal; Notable for the  following components:   WBC 13.5 (*)    All other components within normal limits  HCG, SERUM, QUALITATIVE    No orders to display    Medications - No data to display   Procedures  /  Critical Care Procedures  ED Course and Medical Decision Making  I have reviewed the triage vital signs, the nursing notes, and pertinent available records from the EMR.  Social Determinants Affecting Complexity of Care: Patient has no clinically significant social determinants affecting this chief complaint..   ED Course:    Medical Decision Making Patient here with a couple of days worth of intermittent facial twitching around the left eye.  She does not have any focal findings on exam.  She states that she has had some anxiety around this.  No evidence of Bell's palsy.  She does not demonstrate any weakness, doubt stroke.  Suspect anxiety to be a driving factor.  Will have her follow-up with PCP.  Amount and/or Complexity of Data Reviewed Labs: ordered.         Consultants: No consultations were needed in caring for this patient.   Treatment and Plan: Emergency department workup does not suggest an emergent condition requiring admission or immediate intervention beyond  what has been performed at this time. The patient is safe for discharge and has  been  instructed to return immediately for worsening symptoms, change in  symptoms or any other concerns    Final Clinical Impressions(s) / ED Diagnoses     ICD-10-CM   1. Facial twitching  G51.4     2. Anxiousness  F41.9       ED Discharge Orders     None         Discharge Instructions Discussed with and Provided to Patient:   Discharge Instructions   None      Sherel Dikes, PA-C 02/19/24 0159    Edson Graces, MD 02/19/24 [phone]

## 2024-02-19 NOTE — Assessment & Plan Note (Signed)
 Follows with OBGYN. Currently on oral birth control. Plans to transition to Nexplanon . Deferred health maintenance items for later OV and at her OBGYN office.

## 2024-02-19 NOTE — Progress Notes (Addendum)
 CC: ED f/u   HPI:  [name] is a 23 y.o. female living with a history stated below and presents today for ED f/u. Please see problem based assessment and plan for additional details.  Past Medical History:  Diagnosis Date   Anemia    COVID-19    02/08/19    Current Outpatient Medications on File Prior to Visit  Medication Sig Dispense Refill   acetaminophen  (TYLENOL ) 325 MG tablet Take 2 tablets (650 mg total) by mouth every 4 (four) hours as needed (for pain scale < 4). 30 tablet 0   Drospirenone  (SLYND ) 4 MG TABS Take 1 tablet (4 mg total) by mouth daily. 28 tablet 11   ibuprofen  (ADVIL ) 600 MG tablet Take 1 tablet (600 mg total) by mouth every 6 (six) hours. 30 tablet 0   No current facility-administered medications on file prior to visit.    Family History  Problem Relation Age of Onset   Hypertension Mother    Diabetes Mother    Hypertension Father    Hypertension Sister     Social History   Socioeconomic History   Marital status: Single    Spouse name: Not on file   Number of children: Not on file   Years of education: Not on file   Highest education level: Not on file  Occupational History   Not on file  Tobacco Use   Smoking status: Never   Smokeless tobacco: Never  Vaping Use   Vaping status: Never Used  Substance and Sexual Activity   Alcohol use: Not Currently    Alcohol/week: 1.0 standard drink of alcohol    Types: 1 Cans of beer per week   Drug use: Never   Sexual activity: Yes    Partners: Male    Birth control/protection: None  Other Topics Concern   Not on file  Social History Narrative   Not on file   Social Drivers of Health   Financial Resource Strain: Medium Risk (02/19/2024)   Overall Financial Resource Strain (CARDIA)    Difficulty of Paying Living Expenses: Somewhat hard  Food Insecurity: No Food Insecurity (02/19/2024)   Hunger Vital Sign    Worried About Running Out of Food in the Last Year: Never true    Ran Out of  Food in the Last Year: Never true  Transportation Needs: No Transportation Needs (02/19/2024)   PRAPARE - Administrator, Civil Service (Medical): No    Lack of Transportation (Non-Medical): No  Physical Activity: Inactive (02/19/2024)   Exercise Vital Sign    Days of Exercise per Week: 0 days    Minutes of Exercise per Session: 0 min  Stress: Stress Concern Present (02/19/2024)   [employer] of Occupational Health - Occupational Stress Questionnaire    Feeling of Stress : Very much  Social Connections: Unknown (02/19/2024)   Social Connection and Isolation Panel [NHANES]    Frequency of Communication with Friends and Family: Not on file    Frequency of Social Gatherings with Friends and Family: Once a week    Attends Religious Services: Never    Database administrator or Organizations: No    Attends Banker Meetings: Never    Marital Status: Married  Catering manager Violence: Not At Risk (02/19/2024)   Humiliation, Afraid, Rape, and Kick questionnaire    Fear of Current or Ex-Partner: No    Emotionally Abused: No    Physically Abused: No    Sexually Abused: No  Review of Systems: ROS negative except for what is noted on the assessment and plan.  Vitals:   02/19/24 1415  BP: 121/83  Pulse: 86  Temp: 99 F (37.2 C)  TempSrc: Oral  SpO2: 97%  Weight: 168 lb 9.6 oz (76.5 kg)  Height: 5\' 1"  (1.549 m)   Physical Exam: Constitutional: well-appearing female sitting in chair comfortably, in no acute distress Cardiovascular: regular rate and rhythm Pulmonary/Chest: normal work of breathing on room air Neurological: alert & oriented x 3 Psych: anxious, depressed, denies SI/HI   Assessment & Plan:   Moderate recurrent major depression (HCC) ED visit on 6/4. Presented with L face twitching and numbness. Associated with chest tightness with breaths. States social stressors before symptom presentation. Symptoms resolved after some time. Endorses crying  more and feeling stressed. Not first episode and has had symptoms in past. Hx of depression was on Zoloft  but stopped years ago. States the high dose made her feel flat. Denies SI/HI. No symptoms today. Protective factors: friends that she calls. States she is ready to restart anti-depressant and we discussed therapy which could help. PHQ-9 was 10 and GAD-7 was 11.   Plan -Referral to IBH (Bianca) -Start Lexapro 5 mg daily  -Resources listed on AVS including [facility] -F/u in 4-6 weeks and consider titration of Lexapro   Anxiety See "Moderate recurrent major depression" for A&P.   Healthcare maintenance Follows with OBGYN. Currently on oral birth control. Plans to transition to Nexplanon . Deferred health maintenance items for later OV and at her OBGYN office.    Patient discussed with Dr. Juanna Norman, D.O. [facility] Internal Medicine, PGY-2 Phone: [phone] Date 02/19/2024 Time 5:28 PM

## 2024-02-23 NOTE — Progress Notes (Signed)
 Internal Medicine Clinic Attending  Case discussed with the resident at the time of the visit.  We reviewed the resident's history and exam and pertinent patient test results.  I agree with the assessment, diagnosis, and plan of care documented in the resident's note.

## 2024-03-01 ENCOUNTER — Encounter: Payer: Self-pay | Admitting: Student

## 2024-03-01 ENCOUNTER — Telehealth: Payer: Self-pay | Admitting: Student

## 2024-03-01 NOTE — Telephone Encounter (Signed)
 Opened in Error.

## 2024-03-02 ENCOUNTER — Ambulatory Visit: Admitting: Obstetrics & Gynecology

## 2024-03-02 ENCOUNTER — Encounter: Payer: Self-pay | Admitting: *Deleted

## 2024-03-02 VITALS — BP 121/79 | HR 80 | Ht 61.0 in | Wt 166.0 lb

## 2024-03-02 DIAGNOSIS — Z30017 Encounter for initial prescription of implantable subdermal contraceptive: Secondary | ICD-10-CM

## 2024-03-02 MED ORDER — ETONOGESTREL 68 MG ~~LOC~~ IMPL
68.0000 mg | DRUG_IMPLANT | Freq: Once | SUBCUTANEOUS | Status: AC
  Administered 2024-03-02: 68 mg via SUBCUTANEOUS

## 2024-03-02 NOTE — Progress Notes (Signed)
 GYNECOLOGY OFFICE PROCEDURE NOTE  Katie Ochoa is a 23 y.o. [id number] here for Nexplanon  insertion.  Last pap smear was on 06/27/2022 and was LSIL and needs f/u.  She is taking OCP.  Nexplanon  Insertion Procedure Patient identified, informed consent performed, consent signed.   Patient does understand that irregular bleeding is a very common side effect of this medication. She was advised to have backup contraception for one week after placement. Pregnancy test in clinic today was negative.  Appropriate time out taken.  Patient's left arm was prepped and draped in the usual sterile fashion. The ruler used to measure and mark insertion area.  Patient was prepped with alcohol swab and then injected with 3 ml of 1% lidocaine .  She was prepped with betadine, Nexplanon  removed from packaging,  Device confirmed in needle, then inserted full length of needle and withdrawn per handbook instructions. Nexplanon  was able to palpated in the patient's arm; patient palpated the insert herself. There was minimal blood loss.  Patient insertion site covered with gauze and a pressure bandage to reduce any bruising.  The patient tolerated the procedure well and was given post procedure instructions.  RTC for repeat pap Tresia Fruit, MD  Attending Obstetrician & Gynecologist, [facility]  03/02/2024

## 2024-03-02 NOTE — Progress Notes (Signed)
 Pt is in office for Nexplanon  insertion. Pt is currently on pills. LMP 02/05/24, last intercourse 02/27/24.

## 2024-03-25 ENCOUNTER — Ambulatory Visit: Admitting: Student

## 2024-03-25 VITALS — BP 113/81 | HR 78 | Temp 98.2°F | Ht 61.0 in | Wt 170.6 lb

## 2024-03-25 DIAGNOSIS — Z0000 Encounter for general adult medical examination without abnormal findings: Secondary | ICD-10-CM

## 2024-03-25 DIAGNOSIS — F419 Anxiety disorder, unspecified: Secondary | ICD-10-CM | POA: Diagnosis not present

## 2024-03-25 DIAGNOSIS — F331 Major depressive disorder, recurrent, moderate: Secondary | ICD-10-CM | POA: Diagnosis present

## 2024-03-25 MED ORDER — ESCITALOPRAM OXALATE 10 MG PO TABS: 10.0000 mg | ORAL_TABLET | Freq: Every day | ORAL | 1 refills | Status: DC

## 2024-03-25 NOTE — Progress Notes (Signed)
   Established Patient Office Visit  Subjective   Patient ID: Katie Ochoa, female    DOB: [birth year]  Age: 23 y.o. MRN: [id number]  Chief Complaint  Patient presents with   Follow-up    HPI  This is a 23 year old female with past medical history of moderate recurrent major depression and anxiety coming in here for follow-up on depression.  Last office visit 02/19/2024.  ROS   As per assessment and plan Objective:     BP 113/81 (BP Location: Left Arm, Patient Position: Sitting)   Pulse 78   Temp 98.2 F (36.8 C) (Oral)   Ht 5' 1 (1.549 m)   Wt 170 lb 9.6 oz (77.4 kg)   LMP 02/05/2024 (Approximate)   SpO2 97%   BMI 32.23 kg/m  BP Readings from Last 3 Encounters:  03/25/24 113/81  03/02/24 121/79  02/19/24 121/83   Wt Readings from Last 3 Encounters:  03/25/24 170 lb 9.6 oz (77.4 kg)  03/02/24 166 lb (75.3 kg)  02/19/24 168 lb 9.6 oz (76.5 kg)      Physical Exam  General: Sitting in chair, no acute distress Cardiovascular: Regular rate, no murmurs appreciated Pulmonary: Breathing comfortably, no wheezing or crackles Abdomen: Soft, nontender, nondistended, bowel sounds present MSK: Range of motion intact, no lower extremity edema    Assessment & Plan:  Patient is discussed with Dr. Rosan  Problem List Items Addressed This Visit       Other   Moderate recurrent major depression (HCC)   PHQ-9 score of 6; GAD-7 score of 5.  Per the last office visit, patient was started on Lexapro  5 mg daily.  Patient reports that she started noticing changes in her overall symptoms this week, reports that symptoms of crying spells, uncontrollable thoughts, stressors, has overall improved.  Reports that she is able to sleep well at night.  Reports frustration and anxiety has calm down.  Reports that she has an appointment with Renda on 04/01/2024. -Increase Lexapro  to 10 mg daily -Return to clinic in 4 to 6 weeks for symptoms assessment      Relevant Medications    escitalopram  (LEXAPRO ) 10 MG tablet   Anxiety   Relevant Medications   escitalopram  (LEXAPRO ) 10 MG tablet   Healthcare maintenance - Primary   Patient denied chlamydia screening.       Return in about 1 month (around 04/25/2024) for Depression .    Toma Edwards, DO

## 2024-03-25 NOTE — Patient Instructions (Signed)
 Thank you, Ms.Tiare Passarelli for allowing us  to provide your care today. Today we discussed:  Increase Lexipro 10 mg daily    I have ordered the following labs for you:  Lab Orders  No laboratory test(s) ordered today     Tests ordered today:  None   Referrals ordered today:   Referral Orders  No referral(s) requested today     I have ordered the following medication/changed the following medications:   Stop the following medications: Medications Discontinued During This Encounter  Medication Reason   ibuprofen  (ADVIL ) 600 MG tablet    escitalopram  (LEXAPRO ) 5 MG tablet Reorder     Start the following medications: Meds ordered this encounter  Medications   escitalopram  (LEXAPRO ) 10 MG tablet    Sig: Take 1 tablet (10 mg total) by mouth daily.    Dispense:  30 tablet    Refill:  1     Follow up: 4-6 weeks Depression     Remember:   Should you have any questions or concerns please call the internal medicine clinic at [phone].     Rayann Atway, D.O. [facility]

## 2024-03-26 NOTE — Assessment & Plan Note (Signed)
 Patient denied chlamydia screening.

## 2024-03-26 NOTE — Assessment & Plan Note (Signed)
 PHQ-9 score of 6; GAD-7 score of 5.  Per the last office visit, patient was started on Lexapro  5 mg daily.  Patient reports that she started noticing changes in her overall symptoms this week, reports that symptoms of crying spells, uncontrollable thoughts, stressors, has overall improved.  Reports that she is able to sleep well at night.  Reports frustration and anxiety has calm down.  Reports that she has an appointment with Bianca on 04/01/2024. -Increase Lexapro  to 10 mg daily -Return to clinic in 4 to 6 weeks for symptoms assessment

## 2024-04-01 ENCOUNTER — Telehealth (INDEPENDENT_AMBULATORY_CARE_PROVIDER_SITE_OTHER): Payer: Self-pay | Admitting: Licensed Clinical Social Worker

## 2024-04-01 ENCOUNTER — Institutional Professional Consult (permissible substitution): Admitting: Licensed Clinical Social Worker

## 2024-04-01 DIAGNOSIS — F419 Anxiety disorder, unspecified: Secondary | ICD-10-CM

## 2024-04-01 NOTE — Telephone Encounter (Signed)
 [facility] attempted patient twice on today regarding 10:45 am in-person appointment. Patient no showed. Unable to leave a VM as patients Vm is not set up yet.  Patient will need to contact office to reschedule.   Renda Pontes, MSW, LCSW-A She/Her Behavioral Health Clinician [facility]  Internal Medicine Center

## 2024-04-02 NOTE — Progress Notes (Signed)
 Internal Medicine Clinic Attending  Case discussed with the resident at the time of the visit.  We reviewed the resident's history and exam and pertinent patient test results.  I agree with the assessment, diagnosis, and plan of care documented in the resident's note.

## 2024-06-02 ENCOUNTER — Ambulatory Visit: Payer: Self-pay

## 2024-06-02 NOTE — Telephone Encounter (Signed)
 FYI Only or Action Required?: FYI only for provider.  Patient was last seen in primary care on 03/25/2024 by Heddy Barren, DO.  Called Nurse Triage reporting Depression.  Symptoms began Patient states symptoms have been ongoing for awhile.  Interventions attempted: Prescription medications: Meds for depression.  Symptoms are: gradually worsening.  Triage Disposition: See Physician Within 24 Hours  Patient/caregiver understands and will follow disposition?: Yes       Copied from CRM #[id number]. Topic: Clinical - Red Word Triage >> Jun 02, 2024 12:31 PM Fredrica W wrote: Red Word that prompted transfer to Nurse Triage: Depression Reason for Disposition  [1] Depression AND [2] getting worse (e.g., sleeping poorly, less able to do activities of daily living)  Answer Assessment - Initial Assessment Questions Patient states symptoms have been ongoing for awhile and would not explain further. Patient states she is currently on medications for symptoms and states they are helping some but she needs some additional assistance. Patient given behavioral health resources. Patient states she will [facility]) today for symptoms.     1. CONCERN: What happened that made you call today?     Requesting information about mental health resources for symptoms.  2. DEPRESSION SYMPTOM SCREENING: How are you feeling overall? (e.g., decreased energy, increased sleeping or difficulty sleeping, difficulty concentrating, feelings of sadness, guilt, hopelessness, or worthlessness)     Patient states she has had increased stress lately  3. RISK OF HARM - SUICIDAL IDEATION:  Do you ever have thoughts of hurting or killing yourself?  (e.g., yes, no, no but preoccupation with thoughts about death)     Denies  4. RISK OF HARM - HOMICIDAL IDEATION:  Do you ever have thoughts of hurting or killing someone else?  (e.g., yes, no, no but preoccupation with thoughts about  death)     Denies  5. FUNCTIONAL IMPAIRMENT: How have things been going for you overall? Have you had more difficulty than usual doing your normal daily activities?  (e.g., better, same, worse; self-care, school, work, interactions)     Symptoms are worsening.  Protocols used: Depression-A-AH

## 2024-06-02 NOTE — Telephone Encounter (Signed)
 Attempts to reach out to patient.  Unable to get through.

## 2024-06-03 NOTE — Telephone Encounter (Signed)
 Attempts to call patient .  Unable to get through.  Will try again later.

## 2024-06-08 NOTE — Telephone Encounter (Signed)
 Call to patient unable to reach.  VM full.  Unable to leave message.  Will forward to C. Shirlean to reschedule an appointment.

## 2024-06-16 ENCOUNTER — Ambulatory Visit: Admitting: Licensed Clinical Social Worker

## 2024-06-16 ENCOUNTER — Telehealth (INDEPENDENT_AMBULATORY_CARE_PROVIDER_SITE_OTHER): Payer: Self-pay | Admitting: Licensed Clinical Social Worker

## 2024-06-16 DIAGNOSIS — F419 Anxiety disorder, unspecified: Secondary | ICD-10-CM

## 2024-06-16 NOTE — Telephone Encounter (Signed)
 [facility] attempted patient via telephone regarding 1:30 pm appointment. Patients VM is not set up.  Renda Pontes, MSW, LCSW-A She/Her Behavioral Health Clinician [facility]  Internal Medicine Center

## 2024-06-16 NOTE — BH Specialist Note (Signed)
 Patient no-showed today's appointment; appointment was for Telephone visit at 1:30Pm  Behavioral Health Clinician [facility] from here on out)  attempted patient  via Telephone. [facility] contacted patient from telephone number [phone]. Patients Vm is not set up.  Patient will need to reschedule appointment by calling Internal medicine center [phone].  Renda Pontes, MSW, LCSW-A She/Her Behavioral Health Clinician [facility]  Internal Medicine Center

## 2024-07-07 ENCOUNTER — Ambulatory Visit (INDEPENDENT_AMBULATORY_CARE_PROVIDER_SITE_OTHER): Admitting: Licensed Clinical Social Worker

## 2024-07-07 DIAGNOSIS — F419 Anxiety disorder, unspecified: Secondary | ICD-10-CM | POA: Diagnosis not present

## 2024-07-20 ENCOUNTER — Ambulatory Visit: Admitting: Licensed Clinical Social Worker

## 2024-08-03 NOTE — BH Specialist Note (Unsigned)
 Integrated Behavioral Health via Telemedicine Visit  08/03/2024 Senora Lacson [id number]  Number of Integrated Behavioral Health Clinician visits: No data recorded Session Start time: 1500   Session End time: 1530  Total time in minutes: 30    Referring Provider: PCP Patient/Family location: Home [facility] Provider location: Office All persons participating in visit: [facility] and Patient Types of Service: Introduction only  I connected with Katie Ochoa  via  Telephone and verified that I am speaking with the correct person using two identifiers. Discussed confidentiality: Yes   I discussed the limitations of telemedicine and the availability of in person appointments.  Discussed there is a possibility of technology failure and discussed alternative modes of communication if that failure occurs.  I discussed that engaging in this telemedicine visit, they consent to the provision of behavioral healthcare and the services will be billed under their insurance.  Patient and/or legal guardian expressed understanding and consented to Telemedicine visit: Yes   Presenting Concerns: Patient and/or family reports the following symptoms/concerns: The Licensed Clinical Engineer, Building [employer] (LCSW-A), acting as a Visual Merchandiser [facility]), initiated a session with patient. The [facility] introduced themselves, explained her role, and provided contact information to the patient. Confidentiality and mandated reporting were discussed, and the patient denied any suicidal ideations or intent to harm others. The Integrated Behavioral Health (IBH) approach was reviewed, and a PHQ-9 assessment was completed.    Patient and/or Family's Strengths/Protective Factors: Social connections  Goals Addressed: Patient will:  Reduce symptoms of: anxiety   Progress towards Goals: Ongoing    Interventions: Interventions utilized:  Psychoeducation and/or Health Education Standardized Assessments completed:  PHQ-SADS    03/25/2024   11:10 AM 02/19/2024    3:43 PM 02/19/2024    3:39 PM  PHQ-SADS Last 3 Score only  Total GAD-7 Score 5 11   PHQ Adolescent Score 6  9       Patient and/or Family Response: Patient agrees to services   Clinical Assessment/Diagnosis  Anxiety    Assessment: Patient currently experiencing Anxiety.   Patient may benefit from Ongoing OPT .  Plan: Follow up with behavioral health clinician on : Patient will contact agency to schedule follow up I discussed the assessment and treatment plan with the patient and/or parent/guardian. They were provided an opportunity to ask questions and all were answered. They agreed with the plan and demonstrated an understanding of the instructions.   They were advised to call back or seek an in-person evaluation if the symptoms worsen or if the condition fails to improve as anticipated.  Renda Pontes, MSW, LCSW-A She/Her Behavioral Health Clinician [facility]  Internal Medicine Center

## 2024-08-03 NOTE — Patient Instructions (Signed)
 SABRA

## 2024-10-14 ENCOUNTER — Ambulatory Visit: Payer: Self-pay

## 2024-10-14 NOTE — Telephone Encounter (Signed)
 FYI Only or Action Required?: FYI only for provider: appointment scheduled on 01.30.26.  Patient was last seen in primary care on 03/25/2024 by Katie Barren, DO.  Called Nurse Triage reporting Back Pain.  Symptoms began several weeks ago.  Interventions attempted: Nothing.  Symptoms are: gradually worsening.  Triage Disposition: See PCP When Office is Open (Within 3 Days)  Patient/caregiver understands and will follow disposition?: Yes  Message from [facility] sent at 10/14/2024 11:51 AM EST  Reason for Triage: Hands are swelling, has been all her life but for the last 3 weeks, has been worse. Both hands but mostly right. Lower back is also hurting, for the last 3 weeks. New acne as well.   Reason for Disposition  [1] MODERATE back pain (e.g., interferes with normal activities) AND [2] present > 3 days  Answer Assessment - Initial Assessment Questions 1. ONSET: When did the pain begin? (e.g., minutes, hours, days)      X 2 weeks  2. LOCATION: Where does it hurt? (upper, mid or lower back)     Lower back  3. SEVERITY: How bad is the pain?  (e.g., Scale 1-10; mild, moderate, or severe)     Aching  4. PATTERN: Is the pain constant? (e.g., yes, no; constant, intermittent)       constant  5. RADIATION: Does the pain shoot into your legs or somewhere else?      denies  6. CAUSE:  What do you think is causing the back pain?       Denies  7. BACK OVERUSE:  Any recent lifting of heavy objects, strenuous work or exercise?      denies  8. MEDICINES: What have you taken so far for the pain? (e.g., nothing, acetaminophen , NSAIDS)      denies  9. NEUROLOGIC SYMPTOMS: Pt denies weakness, numbness, or problems with bowel/bladder control?       10. OTHER SYMPTOMS: Pt denies fever, burning with urination, blood in urine        Yes, abdomen pain- bloating. Bilateral hand swelling - palm area between thumb and index fingers  11. PREGNANCY: Is there any chance you  are pregnant? When was your last menstrual period?        Birthcontrol  Pt reports abdominal pain, acne, lower back pain, and hand swelling Pt scheduled for a visit on  01.30.26 for further evaluation. Pt agrees with plan of care, will call back for any worsening symptoms  Protocols used: Back Pain-A-AH

## 2024-10-14 NOTE — Telephone Encounter (Signed)
 Pt has an appt tomorrow 1/30 with Dr Kandis.

## 2024-10-15 ENCOUNTER — Ambulatory Visit: Payer: Self-pay | Admitting: Student

## 2024-10-15 ENCOUNTER — Ambulatory Visit
Admission: RE | Admit: 2024-10-15 | Discharge: 2024-10-15 | Disposition: A | Source: Ambulatory Visit | Attending: Internal Medicine | Admitting: Internal Medicine

## 2024-10-15 VITALS — BP 109/76 | HR 77 | Temp 98.8°F | Ht 61.0 in | Wt 182.8 lb

## 2024-10-15 DIAGNOSIS — F419 Anxiety disorder, unspecified: Secondary | ICD-10-CM | POA: Diagnosis not present

## 2024-10-15 DIAGNOSIS — F331 Major depressive disorder, recurrent, moderate: Secondary | ICD-10-CM | POA: Diagnosis not present

## 2024-10-15 DIAGNOSIS — M7989 Other specified soft tissue disorders: Secondary | ICD-10-CM | POA: Diagnosis present

## 2024-10-15 DIAGNOSIS — M545 Low back pain, unspecified: Secondary | ICD-10-CM | POA: Diagnosis not present

## 2024-10-15 DIAGNOSIS — G8929 Other chronic pain: Secondary | ICD-10-CM

## 2024-10-15 DIAGNOSIS — M549 Dorsalgia, unspecified: Secondary | ICD-10-CM | POA: Insufficient documentation

## 2024-10-15 MED ORDER — ESCITALOPRAM OXALATE 10 MG PO TABS: 10.0000 mg | ORAL_TABLET | Freq: Every day | ORAL | 1 refills | Status: AC

## 2024-10-15 NOTE — Assessment & Plan Note (Signed)
 Patient presents with a history of bilateral hand swelling with the right greater than the left since she was in high school.  She reports that in the past, her hand swelling would improve with restriction of water  and salt intake.  She has had waxing and waning of hand swelling since it first started years prior.  She started noticing worsening symptoms roughly 3 weeks ago when she would wake up in the morning and have worsening hand swelling that would cause her to feel like her hands were stiff in the morning.  She reports that that stiffness feeling was due to the swelling/pressure and not necessarily the joints.  She also feels that the stiffness would improve throughout the day.  She denies any tingling of the hands; however, she does report nonspecific numbness along the dorsal aspect of her MCPs.  On exam: B/L nonpitting edema present on the bilateral hands, right greater than left.  No joint tenderness appreciated.  Range of motion of bilateral hands intact.  No changes to the bilateral nails.  No rashes or skin changes present on exam, no lymphadenopathy or masses in B/L breast  Will evaluate her for inflammatory arthropathy and hypothyroidism. No evidence of masses or lymphadenopathy in the axilla or breasts, low suspicion for obstructive lymphadenopathy. Given the chronicity, low suspicion for DVT.   Plan:  -CBC, CMP, ESR, CRP, B/L hand Xray, TSH, RF, Anti-CCP

## 2024-10-15 NOTE — Patient Instructions (Signed)
 Thank you, [name] for allowing us  to provide your care today. Today we discussed hand swelling, back pain, and anxiety.  Your anxiety score has increased: please continue the lexapro   For the hand swelling: I have ordered labs and Xrays of your hands For the back pain: I ordered physical therapy   I have ordered the following labs for you:   Lab Orders         TSH         Rheumatoid (RA) Factor         CYCLIC CITRUL PEPTIDE ANTIBODY, IGG/IGA         Sed Rate (ESR)         CBC no Diff         Comprehensive metabolic panel with GFR         CRP (C-Reactive Protein)       I have ordered the following medication/changed the following medications:   Stop the following medications: Medications Discontinued During This Encounter  Medication Reason   escitalopram  (LEXAPRO ) 10 MG tablet Reorder     Start the following medications: Meds ordered this encounter  Medications   escitalopram  (LEXAPRO ) 10 MG tablet    Sig: Take 1 tablet (10 mg total) by mouth daily.    Dispense:  30 tablet    Refill:  1     Follow up: 3 months or sooner depending on symptoms and lab results    Remember:   Should you have any questions or concerns please call the internal medicine clinic at [phone].     Please note that our late policy has changed.  If you are more than 15 minutes late to your appointment, you may be asked to reschedule your appointment.  Dr. Kandis, D.O. [facility]

## 2024-10-15 NOTE — Assessment & Plan Note (Signed)
 Patient presents with a history of chronic lumbar back pain that started months prior and comes and goes.  She reports that her pain is better with moving it actually goes away after walking.  The pain is primarily lumbar region and she has some mild discomfort present today.  She denies any triggers or associations with worsening of the pain, she denies trauma, she denies fecal/urinary incontinence or saddle anesthesia.  On exam, she has range of motion intact of her spine.  She has mild generalized discomfort along the midline region of her lumbar spine.  No spasms appreciated. Plan: -Physical therapy ordered -Please see bilateral hand swelling for additional inflammatory arthropathy workup

## 2024-10-15 NOTE — Assessment & Plan Note (Signed)
 GAD-7 score increased from 5 to 8.  Patient has not been taking her Lexapro  10 mg as prescribed. Plan: -Restart Lexapro  10 mg

## 2024-10-15 NOTE — Progress Notes (Signed)
 "  Established Patient Office Visit  Subjective   Patient ID: Katie Ochoa, female    DOB: October 26, 2000  Age: 24 y.o. MRN: [id number]  Chief Complaint  Patient presents with   Medication Refill   hand swelling    Katie Ochoa is a 24 y.o. who presents to the clinic for hand swelling, anxiety, and back pain. Please see problem based assessment and plan for additional details.  Patient Active Problem List   Diagnosis Date Noted   Back pain 10/15/2024   Bilateral hand swelling 10/15/2024   Healthcare maintenance 02/19/2024   Encounter for other contraceptive management 09/25/2023   Supervision of other normal pregnancy, antepartum 05/21/2022   Moderate recurrent major depression (HCC) 11/09/2020   Anxiety 11/09/2020       Objective:     BP 109/76 (BP Location: Left Arm, Patient Position: Sitting, Cuff Size: Small)   Pulse 77   Temp 98.8 F (37.1 C) (Oral)   Ht 5' 1 (1.549 m)   Wt 182 lb 12.8 oz (82.9 kg)   SpO2 100%   BMI 34.54 kg/m  BP Readings from Last 3 Encounters:  10/15/24 109/76  03/25/24 113/81  03/02/24 121/79   Wt Readings from Last 3 Encounters:  10/15/24 182 lb 12.8 oz (82.9 kg)  03/25/24 170 lb 9.6 oz (77.4 kg)  03/02/24 166 lb (75.3 kg)      Physical Exam Vitals reviewed. Exam conducted with a chaperone present.  Constitutional:      General: She is not in acute distress.    Appearance: She is obese. She is not ill-appearing, toxic-appearing or diaphoretic.  Cardiovascular:     Rate and Rhythm: Normal rate and regular rhythm.     Heart sounds: No murmur heard. Pulmonary:     Effort: Pulmonary effort is normal. No respiratory distress.     Breath sounds: Normal breath sounds.  Chest:     Chest wall: No mass.  Breasts:    Right: Normal.     Left: Normal.  Musculoskeletal:     Lumbar back: No swelling or spasms. Normal range of motion.     Right lower leg: No edema.     Left lower leg: No edema.     Comments: B/L nonpitting edema present  on the bilateral hands, right greater than left.  No joint tenderness appreciated.  Range of motion of bilateral hands intact.  No changes to the bilateral nails.  No rashes or skin changes present on exam.  Negative Tinel's test, negative Finkelstein test    Midline generalized back pain in the lumbar region   Lymphadenopathy:     Upper Body:     Right upper body: No axillary or pectoral adenopathy.     Left upper body: No axillary or pectoral adenopathy.  Skin:    General: Skin is warm and dry.     Coloration: Skin is not pale.     Findings: No rash.  Neurological:     Mental Status: She is alert.  Psychiatric:        Mood and Affect: Mood and affect normal.    Last metabolic panel Lab Results  Component Value Date   GLUCOSE 115 (H) 02/18/2024   NA 139 02/18/2024   K 3.5 02/18/2024   CL 105 02/18/2024   CO2 26 02/18/2024   BUN 11 02/18/2024   CREATININE 0.62 02/18/2024   GFRNONAA >60 02/18/2024   CALCIUM 8.9 02/18/2024   PROT 6.3 (L) 02/18/2024   ALBUMIN 3.6 02/18/2024  LABGLOB 2.2 11/09/2020   AGRATIO 1.9 11/09/2020   BILITOT 0.4 02/18/2024   ALKPHOS 62 02/18/2024   AST 17 02/18/2024   ALT 22 02/18/2024   ANIONGAP 8 02/18/2024    Last hemoglobin A1c Lab Results  Component Value Date   HGBA1C 5.9 (H) 06/27/2022      The ASCVD Risk score (Arnett DK, et al., 2019) failed to calculate for the following reasons:   The 2019 ASCVD risk score is only valid for ages 36 to 59   * - Cholesterol units were assumed    Assessment & Plan:   Problem List Items Addressed This Visit       Other   Anxiety   GAD-7 score increased from 5 to 8.  Patient has not been taking her Lexapro  10 mg as prescribed. Plan: -Restart Lexapro  10 mg      Relevant Medications   escitalopram  (LEXAPRO ) 10 MG tablet   Back pain   Patient presents with a history of chronic lumbar back pain that started months prior and comes and goes.  She reports that her pain is better with moving it  actually goes away after walking.  The pain is primarily lumbar region and she has some mild discomfort present today.  She denies any triggers or associations with worsening of the pain, she denies trauma, she denies fecal/urinary incontinence or saddle anesthesia.  On exam, she has range of motion intact of her spine.  She has mild generalized discomfort along the midline region of her lumbar spine.  No spasms appreciated. Plan: -Physical therapy ordered -Please see bilateral hand swelling for additional inflammatory arthropathy workup      Relevant Orders   Ambulatory referral to Physical Therapy   Bilateral hand swelling - Primary   Patient presents with a history of bilateral hand swelling with the right greater than the left since she was in high school.  She reports that in the past, her hand swelling would improve with restriction of water  and salt intake.  She has had waxing and waning of hand swelling since it first started years prior.  She started noticing worsening symptoms roughly 3 weeks ago when she would wake up in the morning and have worsening hand swelling that would cause her to feel like her hands were stiff in the morning.  She reports that that stiffness feeling was due to the swelling/pressure and not necessarily the joints.  She also feels that the stiffness would improve throughout the day.  She denies any tingling of the hands; however, she does report nonspecific numbness along the dorsal aspect of her MCPs.  On exam: B/L nonpitting edema present on the bilateral hands, right greater than left.  No joint tenderness appreciated.  Range of motion of bilateral hands intact.  No changes to the bilateral nails.  No rashes or skin changes present on exam, no lymphadenopathy or masses in B/L breast  Will evaluate her for inflammatory arthropathy and hypothyroidism. No evidence of masses or lymphadenopathy in the axilla or breasts, low suspicion for obstructive lymphadenopathy.  Given the chronicity, low suspicion for DVT.   Plan:  -CBC, CMP, ESR, CRP, B/L hand Xray, TSH, RF, Anti-CCP      Relevant Orders   TSH   Rheumatoid (RA) Factor   CYCLIC CITRUL PEPTIDE ANTIBODY, IGG/IGA   DG Hand Complete Right   DG Hand Complete Left   Sed Rate (ESR)   CBC no Diff   Comprehensive metabolic panel with GFR   CRP (  C-Reactive Protein)   Moderate recurrent major depression (HCC)   Relevant Medications   escitalopram  (LEXAPRO ) 10 MG tablet    Return in about 3 months (around 01/13/2025) for Hand swelling, anxiety .    Damien Lease, DO  "

## 2024-10-16 NOTE — Progress Notes (Signed)
 Internal Medicine Clinic Attending  Case discussed with the resident at the time of the visit.  We reviewed the resident's history and exam and pertinent patient test results.  I agree with the assessment, diagnosis, and plan of care documented in the resident's note.

## 2024-10-18 ENCOUNTER — Ambulatory Visit: Payer: Self-pay | Admitting: Student

## 2024-10-19 LAB — CBC
Hematocrit: 36.1 % (ref 34.0–46.6)
Hemoglobin: 12.1 g/dL (ref 11.1–15.9)
MCH: 29 pg (ref 26.6–33.0)
MCHC: 33.5 g/dL (ref 31.5–35.7)
MCV: 87 fL (ref 79–97)
Platelets: 350 10*3/uL (ref 150–450)
RBC: 4.17 x10E6/uL (ref 3.77–5.28)
RDW: 12.8 % (ref 11.7–15.4)
WBC: 8.6 10*3/uL (ref 3.4–10.8)

## 2024-10-19 LAB — SEDIMENTATION RATE: Sed Rate: 3 mm/h (ref 0–32)

## 2024-10-19 LAB — C-REACTIVE PROTEIN: CRP: 7 mg/L (ref 0–10)

## 2024-10-19 LAB — COMPREHENSIVE METABOLIC PANEL WITH GFR
ALT: 24 [IU]/L (ref 0–32)
AST: 15 [IU]/L (ref 0–40)
Albumin: 3.9 g/dL — ABNORMAL LOW (ref 4.0–5.0)
Alkaline Phosphatase: 66 [IU]/L (ref 41–116)
BUN/Creatinine Ratio: 23 (ref 9–23)
BUN: 12 mg/dL (ref 6–20)
Bilirubin Total: 0.2 mg/dL (ref 0.0–1.2)
CO2: 19 mmol/L — ABNORMAL LOW (ref 20–29)
Calcium: 8.6 mg/dL — ABNORMAL LOW (ref 8.7–10.2)
Chloride: 104 mmol/L (ref 96–106)
Creatinine, Ser: 0.52 mg/dL — ABNORMAL LOW (ref 0.57–1.00)
Globulin, Total: 1.9 g/dL (ref 1.5–4.5)
Glucose: 91 mg/dL (ref 70–99)
Potassium: 4.2 mmol/L (ref 3.5–5.2)
Sodium: 136 mmol/L (ref 134–144)
Total Protein: 5.8 g/dL — ABNORMAL LOW (ref 6.0–8.5)
eGFR: 133 mL/min/{1.73_m2}

## 2024-10-19 LAB — TSH: TSH: 3.59 u[IU]/mL (ref 0.450–4.500)

## 2024-10-19 LAB — CYCLIC CITRUL PEPTIDE ANTIBODY, IGG/IGA: Cyclic Citrullin Peptide Ab: 7 U (ref 0–19)

## 2024-10-19 LAB — RHEUMATOID FACTOR: Rheumatoid fact SerPl-aCnc: <10 [IU]/mL

## 2024-11-02 ENCOUNTER — Ambulatory Visit: Payer: Self-pay | Admitting: Family Medicine
